# Patient Record
Sex: Female | Born: 1940 | Race: White | Hispanic: No | Smoking: Never smoker
Health system: Southern US, Community
[De-identification: ages and names within clinical notes are randomized; demographics above are authoritative.]

## PROBLEM LIST (undated history)

## (undated) DIAGNOSIS — R001 Bradycardia, unspecified: Secondary | ICD-10-CM

## (undated) DIAGNOSIS — M545 Low back pain, unspecified: Secondary | ICD-10-CM

## (undated) DIAGNOSIS — I10 Essential (primary) hypertension: Secondary | ICD-10-CM

## (undated) DIAGNOSIS — M4626 Osteomyelitis of vertebra, lumbar region: Secondary | ICD-10-CM

## (undated) DIAGNOSIS — H9319 Tinnitus, unspecified ear: Secondary | ICD-10-CM

## (undated) DIAGNOSIS — S52501A Unspecified fracture of the lower end of right radius, initial encounter for closed fracture: Secondary | ICD-10-CM

## (undated) DIAGNOSIS — G309 Alzheimer's disease, unspecified: Secondary | ICD-10-CM

## (undated) DIAGNOSIS — M159 Polyosteoarthritis, unspecified: Secondary | ICD-10-CM

## (undated) DIAGNOSIS — F329 Major depressive disorder, single episode, unspecified: Secondary | ICD-10-CM

## (undated) DIAGNOSIS — N189 Chronic kidney disease, unspecified: Secondary | ICD-10-CM

## (undated) DIAGNOSIS — F32A Depression, unspecified: Secondary | ICD-10-CM

## (undated) DIAGNOSIS — E669 Obesity, unspecified: Secondary | ICD-10-CM

## (undated) DIAGNOSIS — F0280 Dementia in other diseases classified elsewhere without behavioral disturbance: Secondary | ICD-10-CM

## (undated) DIAGNOSIS — G8929 Other chronic pain: Secondary | ICD-10-CM

## (undated) DIAGNOSIS — F419 Anxiety disorder, unspecified: Secondary | ICD-10-CM

## (undated) DIAGNOSIS — G4730 Sleep apnea, unspecified: Secondary | ICD-10-CM

## (undated) DIAGNOSIS — G7000 Myasthenia gravis without (acute) exacerbation: Secondary | ICD-10-CM

## (undated) DIAGNOSIS — E119 Type 2 diabetes mellitus without complications: Secondary | ICD-10-CM

## (undated) DIAGNOSIS — G2581 Restless legs syndrome: Secondary | ICD-10-CM

## (undated) DIAGNOSIS — N183 Chronic kidney disease, stage 3 (moderate): Secondary | ICD-10-CM

## (undated) HISTORY — DX: Essential (primary) hypertension: I10

## (undated) HISTORY — DX: Polyosteoarthritis, unspecified: M15.9

## (undated) HISTORY — DX: Low back pain: M54.5

## (undated) HISTORY — DX: Low back pain, unspecified: M54.50

## (undated) HISTORY — DX: Sleep apnea, unspecified: G47.30

## (undated) HISTORY — DX: Alzheimer's disease, unspecified: G30.9

## (undated) HISTORY — DX: Dementia in other diseases classified elsewhere, unspecified severity, without behavioral disturbance, psychotic disturbance, mood disturbance, and anxiety: F02.80

## (undated) HISTORY — DX: Anxiety disorder, unspecified: F41.9

## (undated) HISTORY — DX: Restless legs syndrome: G25.81

## (undated) HISTORY — PX: ABDOMINAL HYSTERECTOMY: SHX81

## (undated) HISTORY — DX: Bradycardia, unspecified: R00.1

## (undated) HISTORY — DX: Other chronic pain: G89.29

## (undated) HISTORY — DX: Tinnitus, unspecified ear: H93.19

## (undated) HISTORY — PX: CERVICAL SPINE SURGERY: SHX589

## (undated) HISTORY — DX: Osteomyelitis of vertebra, lumbar region: M46.26

## (undated) HISTORY — DX: Unspecified fracture of the lower end of right radius, initial encounter for closed fracture: S52.501A

## (undated) HISTORY — DX: Chronic kidney disease, stage 3 (moderate): N18.3

## (undated) HISTORY — PX: LUMBAR SPINE SURGERY: SHX701

## (undated) HISTORY — DX: Type 2 diabetes mellitus without complications: E11.9

---

## 2004-11-22 ENCOUNTER — Ambulatory Visit: Payer: Self-pay | Admitting: Family Medicine

## 2004-12-15 ENCOUNTER — Inpatient Hospital Stay: Payer: Self-pay | Admitting: Cardiology

## 2004-12-15 ENCOUNTER — Other Ambulatory Visit: Payer: Self-pay

## 2004-12-19 ENCOUNTER — Other Ambulatory Visit: Payer: Self-pay

## 2005-01-01 ENCOUNTER — Ambulatory Visit: Payer: Self-pay | Admitting: Family Medicine

## 2005-01-18 ENCOUNTER — Ambulatory Visit: Payer: Self-pay | Admitting: Family Medicine

## 2005-08-14 ENCOUNTER — Ambulatory Visit: Payer: Self-pay | Admitting: Psychiatry

## 2005-11-02 ENCOUNTER — Ambulatory Visit: Payer: Self-pay | Admitting: Psychiatry

## 2005-11-16 ENCOUNTER — Ambulatory Visit: Payer: Self-pay | Admitting: Psychiatry

## 2006-01-29 ENCOUNTER — Ambulatory Visit: Payer: Self-pay | Admitting: Family Medicine

## 2006-02-15 ENCOUNTER — Ambulatory Visit: Payer: Self-pay | Admitting: Family Medicine

## 2009-11-10 ENCOUNTER — Ambulatory Visit: Payer: Self-pay | Admitting: Family Medicine

## 2010-10-10 DIAGNOSIS — I10 Essential (primary) hypertension: Secondary | ICD-10-CM

## 2010-10-10 HISTORY — DX: Essential (primary) hypertension: I10

## 2012-02-15 ENCOUNTER — Ambulatory Visit: Payer: Self-pay | Admitting: Family Medicine

## 2012-11-18 ENCOUNTER — Encounter: Payer: Self-pay | Admitting: Internal Medicine

## 2012-12-01 ENCOUNTER — Encounter: Payer: Self-pay | Admitting: Internal Medicine

## 2012-12-09 LAB — BASIC METABOLIC PANEL
Anion Gap: 4 — ABNORMAL LOW (ref 7–16)
Calcium, Total: 8.5 mg/dL (ref 8.5–10.1)
Co2: 31 mmol/L (ref 21–32)
Creatinine: 0.82 mg/dL (ref 0.60–1.30)
EGFR (Non-African Amer.): >60
Glucose: 94 mg/dL (ref 65–99)
Osmolality: 280 (ref 275–301)
Potassium: 3.3 mmol/L — ABNORMAL LOW (ref 3.5–5.1)

## 2012-12-19 ENCOUNTER — Emergency Department: Payer: Self-pay | Admitting: Emergency Medicine

## 2012-12-19 LAB — COMPREHENSIVE METABOLIC PANEL
Albumin: 2.5 g/dL — ABNORMAL LOW (ref 3.4–5.0)
Alkaline Phosphatase: 133 U/L (ref 50–136)
Anion Gap: 9 (ref 7–16)
Bilirubin,Total: 0.6 mg/dL (ref 0.2–1.0)
Co2: 23 mmol/L (ref 21–32)
Creatinine: 0.88 mg/dL (ref 0.60–1.30)
EGFR (African American): >60
EGFR (Non-African Amer.): >60
SGOT(AST): 29 U/L (ref 15–37)
SGPT (ALT): 15 U/L (ref 12–78)

## 2012-12-19 LAB — URINALYSIS, COMPLETE
Bacteria: NONE SEEN
Blood: NEGATIVE
Glucose,UR: NEGATIVE mg/dL (ref 0–75)
Ketone: NEGATIVE
Leukocyte Esterase: NEGATIVE
Protein: NEGATIVE
RBC,UR: 1 /HPF (ref 0–5)
Specific Gravity: 1.015 (ref 1.003–1.030)
Squamous Epithelial: 1
WBC UR: <1 /HPF (ref 0–5)

## 2012-12-19 LAB — CBC
HCT: 32.6 % — ABNORMAL LOW (ref 35.0–47.0)
HGB: 10.8 g/dL — ABNORMAL LOW (ref 12.0–16.0)
MCH: 30.2 pg (ref 26.0–34.0)
MCHC: 33 g/dL (ref 32.0–36.0)
MCV: 92 fL (ref 80–100)
RBC: 3.55 10*6/uL — ABNORMAL LOW (ref 3.80–5.20)
RDW: 15.3 % — ABNORMAL HIGH (ref 11.5–14.5)
WBC: 7 10*3/uL (ref 3.6–11.0)

## 2012-12-19 LAB — LIPASE, BLOOD: Lipase: 148 U/L (ref 73–393)

## 2012-12-31 DIAGNOSIS — M4626 Osteomyelitis of vertebra, lumbar region: Secondary | ICD-10-CM

## 2012-12-31 HISTORY — PX: CHOLECYSTECTOMY: SHX55

## 2012-12-31 HISTORY — DX: Osteomyelitis of vertebra, lumbar region: M46.26

## 2013-01-08 ENCOUNTER — Ambulatory Visit: Payer: Self-pay | Admitting: Gerontology

## 2013-01-15 ENCOUNTER — Encounter: Payer: Self-pay | Admitting: Internal Medicine

## 2013-01-19 LAB — CBC WITH DIFFERENTIAL/PLATELET
Basophil #: 0.1 10*3/uL (ref 0.0–0.1)
Eosinophil #: 0.3 10*3/uL (ref 0.0–0.7)
HCT: 29.9 % — ABNORMAL LOW (ref 35.0–47.0)
HGB: 9.8 g/dL — ABNORMAL LOW (ref 12.0–16.0)
Lymphocyte #: 1.6 10*3/uL (ref 1.0–3.6)
Lymphocyte %: 39.2 %
MCHC: 32.7 g/dL (ref 32.0–36.0)
MCV: 91 fL (ref 80–100)
Monocyte %: 14.4 %
Neutrophil #: 1.5 10*3/uL (ref 1.4–6.5)
Platelet: 264 10*3/uL (ref 150–440)
RBC: 3.3 10*6/uL — ABNORMAL LOW (ref 3.80–5.20)
RDW: 15.3 % — ABNORMAL HIGH (ref 11.5–14.5)
WBC: 4.1 10*3/uL (ref 3.6–11.0)

## 2013-01-19 LAB — COMPREHENSIVE METABOLIC PANEL
Alkaline Phosphatase: 118 U/L (ref 50–136)
Anion Gap: 4 — ABNORMAL LOW (ref 7–16)
BUN: 11 mg/dL (ref 7–18)
Bilirubin,Total: 0.3 mg/dL (ref 0.2–1.0)
Chloride: 106 mmol/L (ref 98–107)
Co2: 30 mmol/L (ref 21–32)
Creatinine: 0.85 mg/dL (ref 0.60–1.30)
Glucose: 83 mg/dL (ref 65–99)
Osmolality: 278 (ref 275–301)
Potassium: 3.3 mmol/L — ABNORMAL LOW (ref 3.5–5.1)
Total Protein: 5.8 g/dL — ABNORMAL LOW (ref 6.4–8.2)

## 2013-01-19 LAB — SEDIMENTATION RATE: Erythrocyte Sed Rate: 44 mm/hr — ABNORMAL HIGH (ref 0–30)

## 2013-01-26 LAB — CBC WITH DIFFERENTIAL/PLATELET
Basophil #: 0.1 10*3/uL (ref 0.0–0.1)
Eosinophil #: 0.2 10*3/uL (ref 0.0–0.7)
HCT: 32.4 % — ABNORMAL LOW (ref 35.0–47.0)
HGB: 10.6 g/dL — ABNORMAL LOW (ref 12.0–16.0)
Lymphocyte %: 41.5 %
MCH: 29.6 pg (ref 26.0–34.0)
MCV: 90 fL (ref 80–100)
Monocyte %: 12.6 %
Neutrophil #: 1.9 10*3/uL (ref 1.4–6.5)
Platelet: 266 10*3/uL (ref 150–440)
RDW: 15.3 % — ABNORMAL HIGH (ref 11.5–14.5)
WBC: 4.8 10*3/uL (ref 3.6–11.0)

## 2013-01-26 LAB — SEDIMENTATION RATE: Erythrocyte Sed Rate: 39 mm/hr — ABNORMAL HIGH (ref 0–30)

## 2013-01-26 LAB — COMPREHENSIVE METABOLIC PANEL
Albumin: 2.2 g/dL — ABNORMAL LOW (ref 3.4–5.0)
Alkaline Phosphatase: 135 U/L (ref 50–136)
Anion Gap: 5 — ABNORMAL LOW (ref 7–16)
Co2: 30 mmol/L (ref 21–32)
EGFR (African American): >60
EGFR (Non-African Amer.): >60
Osmolality: 280 (ref 275–301)
Potassium: 3.8 mmol/L (ref 3.5–5.1)
Sodium: 140 mmol/L (ref 136–145)

## 2013-01-31 ENCOUNTER — Encounter: Payer: Self-pay | Admitting: Internal Medicine

## 2013-02-02 LAB — CBC WITH DIFFERENTIAL/PLATELET
Basophil %: 1.4 %
Eosinophil #: 0.2 10*3/uL (ref 0.0–0.7)
HGB: 10.3 g/dL — ABNORMAL LOW (ref 12.0–16.0)
Lymphocyte #: 1.7 10*3/uL (ref 1.0–3.6)
Lymphocyte %: 28.3 %
MCHC: 33.2 g/dL (ref 32.0–36.0)
MCV: 90 fL (ref 80–100)
Monocyte #: 0.5 x10 3/mm (ref 0.2–0.9)
Monocyte %: 8.8 %
RDW: 15.4 % — ABNORMAL HIGH (ref 11.5–14.5)
WBC: 6.1 10*3/uL (ref 3.6–11.0)

## 2013-02-02 LAB — COMPREHENSIVE METABOLIC PANEL
Alkaline Phosphatase: 139 U/L — ABNORMAL HIGH (ref 50–136)
Anion Gap: 3 — ABNORMAL LOW (ref 7–16)
Bilirubin,Total: 0.2 mg/dL (ref 0.2–1.0)
Chloride: 105 mmol/L (ref 98–107)
Co2: 31 mmol/L (ref 21–32)
Creatinine: 0.81 mg/dL (ref 0.60–1.30)
EGFR (Non-African Amer.): >60
Glucose: 87 mg/dL (ref 65–99)
SGOT(AST): 17 U/L (ref 15–37)
SGPT (ALT): 13 U/L (ref 12–78)
Total Protein: 6 g/dL — ABNORMAL LOW (ref 6.4–8.2)

## 2013-02-02 LAB — SEDIMENTATION RATE: Erythrocyte Sed Rate: 43 mm/hr — ABNORMAL HIGH (ref 0–30)

## 2013-02-04 ENCOUNTER — Emergency Department: Payer: Self-pay | Admitting: Emergency Medicine

## 2013-02-04 LAB — CBC WITH DIFFERENTIAL/PLATELET
Basophil %: 0.9 %
Eosinophil #: 0.1 10*3/uL (ref 0.0–0.7)
HGB: 11.5 g/dL — ABNORMAL LOW (ref 12.0–16.0)
Lymphocyte #: 1.4 10*3/uL (ref 1.0–3.6)
Lymphocyte %: 14.8 %
MCH: 30.5 pg (ref 26.0–34.0)
Monocyte #: 0.8 x10 3/mm (ref 0.2–0.9)
Monocyte %: 8.1 %
Neutrophil #: 7.3 10*3/uL — ABNORMAL HIGH (ref 1.4–6.5)
Neutrophil %: 75.5 %
Platelet: 313 10*3/uL (ref 150–440)
RBC: 3.78 10*6/uL — ABNORMAL LOW (ref 3.80–5.20)
RDW: 15.7 % — ABNORMAL HIGH (ref 11.5–14.5)
WBC: 9.6 10*3/uL (ref 3.6–11.0)

## 2013-02-04 LAB — COMPREHENSIVE METABOLIC PANEL
Alkaline Phosphatase: 150 U/L — ABNORMAL HIGH (ref 50–136)
Anion Gap: 7 (ref 7–16)
BUN: 15 mg/dL (ref 7–18)
Bilirubin,Total: 0.5 mg/dL (ref 0.2–1.0)
Co2: 26 mmol/L (ref 21–32)
Creatinine: 0.96 mg/dL (ref 0.60–1.30)
Glucose: 109 mg/dL — ABNORMAL HIGH (ref 65–99)
Osmolality: 277 (ref 275–301)
Potassium: 3.6 mmol/L (ref 3.5–5.1)
SGOT(AST): 24 U/L (ref 15–37)
Sodium: 138 mmol/L (ref 136–145)
Total Protein: 6.8 g/dL (ref 6.4–8.2)

## 2013-02-27 ENCOUNTER — Other Ambulatory Visit: Payer: Self-pay | Admitting: Family Medicine

## 2013-02-27 LAB — CREATININE, SERUM
Creatinine: 0.67 mg/dL (ref 0.60–1.30)
EGFR (Non-African Amer.): >60

## 2013-02-27 LAB — VANCOMYCIN, TROUGH: Vancomycin, Trough: 13 ug/mL (ref 10–20)

## 2013-03-02 ENCOUNTER — Other Ambulatory Visit: Payer: Self-pay | Admitting: Family Medicine

## 2013-03-02 ENCOUNTER — Encounter: Payer: Self-pay | Admitting: Internal Medicine

## 2013-03-02 LAB — CREATININE, SERUM
Creatinine: 0.83 mg/dL (ref 0.60–1.30)
EGFR (African American): >60
EGFR (Non-African Amer.): >60

## 2013-03-02 LAB — VANCOMYCIN, TROUGH: Vancomycin, Trough: 16 ug/mL (ref 10–20)

## 2013-03-03 ENCOUNTER — Other Ambulatory Visit: Payer: Self-pay | Admitting: Family Medicine

## 2013-03-03 LAB — COMPREHENSIVE METABOLIC PANEL
Alkaline Phosphatase: 121 U/L — ABNORMAL HIGH
Calcium, Total: 8.6 mg/dL (ref 8.5–10.1)
Chloride: 105 mmol/L (ref 98–107)
Co2: 30 mmol/L (ref 21–32)
EGFR (Non-African Amer.): 52 — ABNORMAL LOW
Potassium: 3.3 mmol/L — ABNORMAL LOW (ref 3.5–5.1)
SGOT(AST): 21 U/L (ref 15–37)
SGPT (ALT): 11 U/L — ABNORMAL LOW (ref 12–78)

## 2013-03-03 LAB — CBC WITH DIFFERENTIAL/PLATELET
Basophil %: 1 %
HGB: 11.2 g/dL — ABNORMAL LOW (ref 12.0–16.0)
Lymphocyte #: 1 10*3/uL (ref 1.0–3.6)
MCH: 30.4 pg (ref 26.0–34.0)
MCHC: 32.9 g/dL (ref 32.0–36.0)
Monocyte %: 7.8 %
Neutrophil #: 4.2 10*3/uL (ref 1.4–6.5)
Neutrophil %: 72.7 %
Platelet: 274 10*3/uL (ref 150–440)
WBC: 5.7 10*3/uL (ref 3.6–11.0)

## 2013-03-06 ENCOUNTER — Other Ambulatory Visit: Payer: Self-pay | Admitting: Family Medicine

## 2013-03-06 LAB — URINALYSIS, COMPLETE
Bacteria: NONE SEEN
Glucose,UR: NEGATIVE mg/dL (ref 0–75)
Ketone: NEGATIVE
Leukocyte Esterase: NEGATIVE
RBC,UR: 1 /HPF (ref 0–5)
Squamous Epithelial: 3

## 2013-03-09 ENCOUNTER — Other Ambulatory Visit: Payer: Self-pay | Admitting: Family Medicine

## 2013-03-09 LAB — CREATININE, SERUM
Creatinine: 0.92 mg/dL (ref 0.60–1.30)
EGFR (Non-African Amer.): >60

## 2013-03-09 LAB — VANCOMYCIN, TROUGH: Vancomycin, Trough: 19 ug/mL (ref 10–20)

## 2013-03-11 ENCOUNTER — Other Ambulatory Visit: Payer: Self-pay | Admitting: Family Medicine

## 2013-03-11 LAB — CREATININE, SERUM
EGFR (African American): >60
EGFR (Non-African Amer.): >60

## 2013-03-11 LAB — VANCOMYCIN, TROUGH: Vancomycin, Trough: 21 ug/mL (ref 10–20)

## 2013-03-16 ENCOUNTER — Other Ambulatory Visit: Payer: Self-pay | Admitting: Family Medicine

## 2013-03-16 LAB — VANCOMYCIN, TROUGH: Vancomycin, Trough: 16 ug/mL (ref 10–20)

## 2013-03-16 LAB — CREATININE, SERUM
Creatinine: 0.84 mg/dL (ref 0.60–1.30)
EGFR (African American): >60
EGFR (Non-African Amer.): >60

## 2013-03-20 ENCOUNTER — Other Ambulatory Visit: Payer: Self-pay | Admitting: Family Medicine

## 2013-03-20 LAB — CREATININE, SERUM: Creatinine: 0.94 mg/dL (ref 0.60–1.30)

## 2013-03-20 LAB — VANCOMYCIN, TROUGH: Vancomycin, Trough: 18 ug/mL (ref 10–20)

## 2013-03-22 ENCOUNTER — Other Ambulatory Visit: Payer: Self-pay | Admitting: Family Medicine

## 2013-03-22 LAB — CREATININE, SERUM
Creatinine: 0.82 mg/dL (ref 0.60–1.30)
EGFR (African American): >60

## 2013-03-22 LAB — VANCOMYCIN, TROUGH: Vancomycin, Trough: 17 ug/mL (ref 10–20)

## 2013-03-23 ENCOUNTER — Other Ambulatory Visit: Payer: Self-pay | Admitting: Family Medicine

## 2013-03-23 LAB — CLOSTRIDIUM DIFFICILE(ARMC)

## 2013-03-31 ENCOUNTER — Other Ambulatory Visit: Payer: Self-pay | Admitting: Family Medicine

## 2013-03-31 LAB — URINALYSIS, COMPLETE
Bilirubin,UR: NEGATIVE
Ketone: NEGATIVE
Ph: 5 (ref 4.5–8.0)
RBC,UR: 61 /HPF (ref 0–5)
Squamous Epithelial: 307
WBC UR: 1048 /HPF (ref 0–5)

## 2013-04-02 ENCOUNTER — Encounter: Payer: Self-pay | Admitting: Internal Medicine

## 2013-04-04 LAB — URINE CULTURE

## 2013-04-07 ENCOUNTER — Other Ambulatory Visit: Payer: Self-pay | Admitting: Family Medicine

## 2013-04-08 LAB — CLOSTRIDIUM DIFFICILE(ARMC)

## 2013-04-09 ENCOUNTER — Other Ambulatory Visit: Payer: Self-pay | Admitting: Family Medicine

## 2013-04-09 LAB — CLOSTRIDIUM DIFFICILE(ARMC)

## 2013-04-14 ENCOUNTER — Other Ambulatory Visit: Payer: Self-pay | Admitting: Family Medicine

## 2013-04-14 LAB — URINALYSIS, COMPLETE
Blood: NEGATIVE
Glucose,UR: NEGATIVE mg/dL (ref 0–75)
Ketone: NEGATIVE
Nitrite: NEGATIVE
PH: 5 (ref 4.5–8.0)
Protein: 30
RBC,UR: 15 /HPF (ref 0–5)
SPECIFIC GRAVITY: 1.036 (ref 1.003–1.030)
WBC UR: 9 /HPF (ref 0–5)

## 2013-04-17 LAB — URINE CULTURE

## 2014-06-06 DIAGNOSIS — S52501A Unspecified fracture of the lower end of right radius, initial encounter for closed fracture: Secondary | ICD-10-CM

## 2014-06-06 HISTORY — DX: Unspecified fracture of the lower end of right radius, initial encounter for closed fracture: S52.501A

## 2014-08-03 DIAGNOSIS — N183 Chronic kidney disease, stage 3 unspecified: Secondary | ICD-10-CM

## 2014-08-03 HISTORY — DX: Chronic kidney disease, stage 3 unspecified: N18.30

## 2014-09-29 ENCOUNTER — Other Ambulatory Visit: Payer: Self-pay | Admitting: Family Medicine

## 2014-09-29 DIAGNOSIS — Z780 Asymptomatic menopausal state: Secondary | ICD-10-CM

## 2015-07-14 ENCOUNTER — Encounter: Payer: Self-pay | Admitting: *Deleted

## 2015-07-18 ENCOUNTER — Encounter: Admission: RE | Payer: Self-pay | Source: Ambulatory Visit

## 2015-07-18 ENCOUNTER — Ambulatory Visit: Admission: RE | Admit: 2015-07-18 | Payer: Medicare HMO | Source: Ambulatory Visit | Admitting: Gastroenterology

## 2015-07-18 HISTORY — DX: Essential (primary) hypertension: I10

## 2015-07-18 HISTORY — DX: Major depressive disorder, single episode, unspecified: F32.9

## 2015-07-18 HISTORY — DX: Chronic kidney disease, unspecified: N18.9

## 2015-07-18 HISTORY — DX: Obesity, unspecified: E66.9

## 2015-07-18 HISTORY — DX: Type 2 diabetes mellitus without complications: E11.9

## 2015-07-18 HISTORY — DX: Myasthenia gravis without (acute) exacerbation: G70.00

## 2015-07-18 HISTORY — DX: Depression, unspecified: F32.A

## 2015-07-18 SURGERY — ESOPHAGOGASTRODUODENOSCOPY (EGD) WITH PROPOFOL
Anesthesia: General

## 2015-09-06 ENCOUNTER — Other Ambulatory Visit: Payer: Self-pay | Admitting: Family Medicine

## 2015-09-06 DIAGNOSIS — Z780 Asymptomatic menopausal state: Secondary | ICD-10-CM

## 2016-03-16 ENCOUNTER — Other Ambulatory Visit: Payer: Self-pay | Admitting: Family Medicine

## 2016-03-16 DIAGNOSIS — R413 Other amnesia: Secondary | ICD-10-CM

## 2016-03-27 ENCOUNTER — Ambulatory Visit: Payer: Medicare HMO

## 2016-06-04 ENCOUNTER — Inpatient Hospital Stay
Admission: EM | Admit: 2016-06-04 | Discharge: 2016-06-05 | DRG: 195 | Disposition: A | Discharge status: Home Health | Payer: MEDICARE | Attending: Internal Medicine | Admitting: Internal Medicine

## 2016-06-04 ENCOUNTER — Encounter: Payer: Self-pay | Admitting: *Deleted

## 2016-06-04 ENCOUNTER — Emergency Department: Payer: MEDICARE

## 2016-06-04 DIAGNOSIS — Z79899 Other long term (current) drug therapy: Secondary | ICD-10-CM

## 2016-06-04 DIAGNOSIS — E1122 Type 2 diabetes mellitus with diabetic chronic kidney disease: Secondary | ICD-10-CM | POA: Diagnosis present

## 2016-06-04 DIAGNOSIS — R410 Disorientation, unspecified: Secondary | ICD-10-CM

## 2016-06-04 DIAGNOSIS — Z885 Allergy status to narcotic agent status: Secondary | ICD-10-CM

## 2016-06-04 DIAGNOSIS — I129 Hypertensive chronic kidney disease with stage 1 through stage 4 chronic kidney disease, or unspecified chronic kidney disease: Secondary | ICD-10-CM | POA: Diagnosis present

## 2016-06-04 DIAGNOSIS — F0280 Dementia in other diseases classified elsewhere without behavioral disturbance: Secondary | ICD-10-CM | POA: Diagnosis not present

## 2016-06-04 DIAGNOSIS — J189 Pneumonia, unspecified organism: Secondary | ICD-10-CM | POA: Diagnosis present

## 2016-06-04 DIAGNOSIS — E876 Hypokalemia: Secondary | ICD-10-CM | POA: Diagnosis not present

## 2016-06-04 DIAGNOSIS — N189 Chronic kidney disease, unspecified: Secondary | ICD-10-CM | POA: Diagnosis not present

## 2016-06-04 DIAGNOSIS — Z880 Allergy status to penicillin: Secondary | ICD-10-CM

## 2016-06-04 DIAGNOSIS — G7000 Myasthenia gravis without (acute) exacerbation: Secondary | ICD-10-CM | POA: Diagnosis not present

## 2016-06-04 DIAGNOSIS — Z888 Allergy status to other drugs, medicaments and biological substances status: Secondary | ICD-10-CM | POA: Diagnosis not present

## 2016-06-04 DIAGNOSIS — G309 Alzheimer's disease, unspecified: Secondary | ICD-10-CM | POA: Diagnosis not present

## 2016-06-04 LAB — CBC
HEMATOCRIT: 39.4 % (ref 35.0–47.0)
HEMOGLOBIN: 13.3 g/dL (ref 12.0–16.0)
MCH: 32.1 pg (ref 26.0–34.0)
MCHC: 33.8 g/dL (ref 32.0–36.0)
MCV: 95.2 fL (ref 80.0–100.0)
Platelets: 274 10*3/uL (ref 150–440)
RBC: 4.14 MIL/uL (ref 3.80–5.20)
RDW: 13.3 % (ref 11.5–14.5)
WBC: 9.4 10*3/uL (ref 3.6–11.0)

## 2016-06-04 LAB — TROPONIN I: Troponin I: <0.03 ng/mL (ref <0.03)

## 2016-06-04 LAB — BASIC METABOLIC PANEL
ANION GAP: 7 (ref 5–15)
BUN: 15 mg/dL (ref 6–20)
CO2: 27 mmol/L (ref 22–32)
Calcium: 9.1 mg/dL (ref 8.9–10.3)
Chloride: 105 mmol/L (ref 101–111)
Creatinine, Ser: 0.81 mg/dL (ref 0.44–1.00)
GFR calc Af Amer: >60 mL/min (ref >60)
GLUCOSE: 175 mg/dL — AB (ref 65–99)
POTASSIUM: 3.1 mmol/L — AB (ref 3.5–5.1)
SODIUM: 139 mmol/L (ref 135–145)

## 2016-06-04 LAB — INFLUENZA PANEL BY PCR (TYPE A & B)
INFLAPCR: NEGATIVE
Influenza B By PCR: NEGATIVE

## 2016-06-04 LAB — LACTIC ACID, PLASMA: LACTIC ACID, VENOUS: 1.3 mmol/L (ref 0.5–1.9)

## 2016-06-04 MED ORDER — PYRIDOSTIGMINE BROMIDE 60 MG PO TABS
60.0000 mg | ORAL_TABLET | Freq: Three times a day (TID) | ORAL | Status: DC
  Administered 2016-06-04 – 2016-06-05 (×3): 60 mg via ORAL
  Filled 2016-06-04 (×5): qty 1

## 2016-06-04 MED ORDER — FLUOXETINE HCL 20 MG PO CAPS
20.0000 mg | ORAL_CAPSULE | Freq: Every day | ORAL | Status: DC
  Administered 2016-06-05: 20 mg via ORAL
  Filled 2016-06-04: qty 1

## 2016-06-04 MED ORDER — DEXTROSE 5 % IV SOLN: 1.0000 g | Freq: Once | INTRAVENOUS | Status: DC

## 2016-06-04 MED ORDER — DEXTROSE 5 % IV SOLN
1.0000 g | INTRAVENOUS | Status: DC
  Administered 2016-06-05: 1 g via INTRAVENOUS
  Filled 2016-06-04: qty 10

## 2016-06-04 MED ORDER — SODIUM CHLORIDE 0.9 % IV SOLN
Freq: Once | INTRAVENOUS | Status: AC
  Administered 2016-06-04: 22:00:00 via INTRAVENOUS

## 2016-06-04 MED ORDER — ONDANSETRON HCL 4 MG PO TABS: 4.0000 mg | ORAL_TABLET | Freq: Four times a day (QID) | ORAL | Status: DC | PRN

## 2016-06-04 MED ORDER — OXYCODONE HCL ER 10 MG PO T12A
10.0000 mg | EXTENDED_RELEASE_TABLET | Freq: Two times a day (BID) | ORAL | Status: DC
  Administered 2016-06-04 – 2016-06-05 (×2): 10 mg via ORAL
  Filled 2016-06-04 (×2): qty 1

## 2016-06-04 MED ORDER — OXYBUTYNIN CHLORIDE 5 MG PO TABS
5.0000 mg | ORAL_TABLET | Freq: Three times a day (TID) | ORAL | Status: DC
  Administered 2016-06-04 – 2016-06-05 (×3): 5 mg via ORAL
  Filled 2016-06-04 (×3): qty 1

## 2016-06-04 MED ORDER — ACETAMINOPHEN 325 MG PO TABS: 650.0000 mg | ORAL_TABLET | Freq: Four times a day (QID) | ORAL | Status: DC | PRN

## 2016-06-04 MED ORDER — ONDANSETRON HCL 4 MG/2ML IJ SOLN: 4.0000 mg | Freq: Four times a day (QID) | INTRAMUSCULAR | Status: DC | PRN

## 2016-06-04 MED ORDER — LIOTHYRONINE SODIUM 25 MCG PO TABS
25.0000 ug | ORAL_TABLET | Freq: Every day | ORAL | Status: DC
  Administered 2016-06-05: 25 ug via ORAL
  Filled 2016-06-04: qty 1

## 2016-06-04 MED ORDER — CEFTRIAXONE SODIUM-DEXTROSE 1-3.74 GM-% IV SOLR
1.0000 g | Freq: Once | INTRAVENOUS | Status: AC
  Administered 2016-06-04: 1 g via INTRAVENOUS
  Filled 2016-06-04: qty 50

## 2016-06-04 MED ORDER — GUAIFENESIN-DM 100-10 MG/5ML PO SYRP
5.0000 mL | ORAL_SOLUTION | ORAL | Status: DC | PRN
  Administered 2016-06-04: 5 mL via ORAL
  Filled 2016-06-04 (×2): qty 5

## 2016-06-04 MED ORDER — FOLIC ACID 1 MG PO TABS
1.0000 mg | ORAL_TABLET | Freq: Every day | ORAL | Status: DC
  Administered 2016-06-05: 1 mg via ORAL
  Filled 2016-06-04: qty 1

## 2016-06-04 MED ORDER — ENOXAPARIN SODIUM 40 MG/0.4ML ~~LOC~~ SOLN
40.0000 mg | SUBCUTANEOUS | Status: DC
  Administered 2016-06-04: 40 mg via SUBCUTANEOUS
  Filled 2016-06-04: qty 0.4

## 2016-06-04 MED ORDER — DEXTROSE 5 % IV SOLN
500.0000 mg | INTRAVENOUS | Status: DC
  Administered 2016-06-05: 500 mg via INTRAVENOUS
  Filled 2016-06-04: qty 500

## 2016-06-04 MED ORDER — ALBUTEROL SULFATE (2.5 MG/3ML) 0.083% IN NEBU: 2.5000 mg | INHALATION_SOLUTION | RESPIRATORY_TRACT | Status: DC | PRN

## 2016-06-04 MED ORDER — MYCOPHENOLATE MOFETIL 250 MG PO CAPS
250.0000 mg | ORAL_CAPSULE | Freq: Two times a day (BID) | ORAL | Status: DC
  Administered 2016-06-04 – 2016-06-05 (×2): 250 mg via ORAL
  Filled 2016-06-04 (×3): qty 1

## 2016-06-04 MED ORDER — POTASSIUM CHLORIDE CRYS ER 20 MEQ PO TBCR
20.0000 meq | EXTENDED_RELEASE_TABLET | Freq: Every day | ORAL | Status: DC
  Administered 2016-06-05: 20 meq via ORAL
  Filled 2016-06-04: qty 1

## 2016-06-04 MED ORDER — INSULIN ASPART 100 UNIT/ML ~~LOC~~ SOLN: 0.0000 [IU] | Freq: Three times a day (TID) | SUBCUTANEOUS | Status: DC

## 2016-06-04 MED ORDER — TRAZODONE HCL 50 MG PO TABS
50.0000 mg | ORAL_TABLET | Freq: Every day | ORAL | Status: DC
  Administered 2016-06-04: 50 mg via ORAL
  Filled 2016-06-04: qty 1

## 2016-06-04 MED ORDER — ACETAMINOPHEN 650 MG RE SUPP
650.0000 mg | Freq: Four times a day (QID) | RECTAL | Status: DC | PRN
  Filled 2016-06-04: qty 1

## 2016-06-04 MED ORDER — AZITHROMYCIN 500 MG IV SOLR
500.0000 mg | Freq: Once | INTRAVENOUS | Status: AC
  Administered 2016-06-04: 500 mg via INTRAVENOUS
  Filled 2016-06-04 (×2): qty 500

## 2016-06-04 MED ORDER — POLYETHYLENE GLYCOL 3350 17 G PO PACK: 17.0000 g | PACK | Freq: Every day | ORAL | Status: DC | PRN

## 2016-06-04 MED ORDER — FAMOTIDINE 20 MG PO TABS
20.0000 mg | ORAL_TABLET | Freq: Two times a day (BID) | ORAL | Status: DC
  Administered 2016-06-04 – 2016-06-05 (×2): 20 mg via ORAL
  Filled 2016-06-04 (×2): qty 1

## 2016-06-04 MED ORDER — FUROSEMIDE 20 MG PO TABS
20.0000 mg | ORAL_TABLET | Freq: Every day | ORAL | Status: DC
  Administered 2016-06-05: 20 mg via ORAL
  Filled 2016-06-04: qty 1

## 2016-06-04 MED ORDER — BUSPIRONE HCL 10 MG PO TABS
20.0000 mg | ORAL_TABLET | Freq: Two times a day (BID) | ORAL | Status: DC
  Administered 2016-06-04 – 2016-06-05 (×2): 20 mg via ORAL
  Filled 2016-06-04 (×3): qty 2

## 2016-06-04 MED ORDER — SODIUM CHLORIDE 0.9 % IV SOLN
30.0000 meq | Freq: Once | INTRAVENOUS | Status: AC
  Administered 2016-06-04: 30 meq via INTRAVENOUS
  Filled 2016-06-04: qty 15

## 2016-06-04 NOTE — H&P (Signed)
[facility] - [place] at [facility]   PATIENT NAME: Glenda Stephens    MR#:  [id number]  DATE OF BIRTH:  1941-03-20  DATE OF ADMISSION:  06/04/2016  PRIMARY CARE PHYSICIAN: No primary care provider on file.   REQUESTING/REFERRING PHYSICIAN: Dr. York Cerise  CHIEF COMPLAINT:   Chief Complaint  Patient presents with  . Cough    HISTORY OF PRESENT ILLNESS:  Glenda Stephens  is a 76 y.o. female with a known history of Hypertension, diabetes, Alzheimer's dementia who was recently treated for UTI with ciprofloxacin presents to the emergency room with 5 days of cough, weakness, worsening confusion. Chest x-ray checked as outpatient showed multifocal pneumonia and was sent to the emergency room. Here patient does not have fever or WBC but due to her advanced age and history of myasthenia gravis and failed outpatient therapies being admitted as inpatient for IV antibiotics. She has a dry cough. Afebrile. No sick contacts. We'll test pending.  PAST MEDICAL HISTORY:   Past Medical History:  Diagnosis Date  . Chronic kidney disease   . Depression   . Diabetes mellitus without complication (HCC)   . Hypertension   . Myasthenia gravis without (acute) exacerbation (HCC)   . Obesity     PAST SURGICAL HISTORY:   Past Surgical History:  Procedure Laterality Date  . ABDOMINAL HYSTERECTOMY    . CERVICAL SPINE SURGERY    . CHOLECYSTECTOMY    . LUMBAR SPINE SURGERY      SOCIAL HISTORY:   Social History  Substance Use Topics  . Smoking status: Never Smoker  . Smokeless tobacco: Never Used  . Alcohol use No    FAMILY HISTORY:  History reviewed. No pertinent family history.  DRUG ALLERGIES:   Allergies  Allergen Reactions  . Ativan [Lorazepam]   . Botox [Botulinum Toxin Type A]   . Codeine   . Dilaudid [Hydromorphone]   . Magnesium-Containing Compounds   . Penicillamine   . Penicillins     REVIEW OF SYSTEMS:   Review of Systems  Unable to perform ROS: Dementia     MEDICATIONS AT HOME:   Prior to Admission medications   Medication Sig Start Date End Date Taking? Authorizing Provider  acetaminophen (TYLENOL) 325 MG tablet Take 650 mg by mouth 3 (three) times daily.    Historical Provider, MD  busPIRone (BUSPAR) 10 MG tablet Take 20 mg by mouth 2 (two) times daily.    Historical Provider, MD  calcitonin, salmon, (MIACALCIN/FORTICAL) 200 UNIT/ACT nasal spray Place 1 spray into alternate nostrils daily.    Historical Provider, MD  diclofenac (VOLTAREN) 50 MG EC tablet Take 50 mg by mouth 2 (two) times daily.    Historical Provider, MD  FLUoxetine (PROZAC) 20 MG capsule Take 20 mg by mouth daily.    Historical Provider, MD  FLUoxetine (PROZAC) 40 MG capsule Take 40 mg by mouth daily.    Historical Provider, MD  folic acid (FOLVITE) 1 MG tablet Take 1 mg by mouth daily.    Historical Provider, MD  furosemide (LASIX) 20 MG tablet Take 20 mg by mouth.    Historical Provider, MD  liothyronine (CYTOMEL) 25 MCG tablet Take by mouth daily.    Historical Provider, MD  Multiple Vitamin (MULTIVITAMIN) tablet Take 1 tablet by mouth daily.    Historical Provider, MD  mycophenolate (CELLCEPT) 250 MG capsule Take by mouth 2 (two) times daily.    Historical Provider, MD  omeprazole (PRILOSEC) 20 MG capsule Take 20 mg by mouth daily.  Historical Provider, MD  oxybutynin (DITROPAN) 5 MG tablet Take 5 mg by mouth 3 (three) times daily.    Historical Provider, MD  oxyCODONE (OXYCONTIN) 10 mg 12 hr tablet Take 10 mg by mouth every 12 (twelve) hours.    Historical Provider, MD  potassium chloride (K-DUR) 10 MEQ tablet Take 10 mEq by mouth daily.    Historical Provider, MD  pyridostigmine (MESTINON) 60 MG tablet Take 60 mg by mouth 3 (three) times daily.    Historical Provider, MD  ranitidine (ZANTAC) 150 MG capsule Take 150 mg by mouth 2 (two) times daily.    Historical Provider, MD  tiZANidine (ZANAFLEX) 2 MG tablet Take by mouth 3 (three) times daily as needed for muscle  spasms.    Historical Provider, MD  traZODone (DESYREL) 50 MG tablet Take 50 mg by mouth at bedtime.    Historical Provider, MD     VITAL SIGNS:  Blood pressure (!) 144/74, pulse (!) 52, temperature 98.1 F (36.7 C), temperature source Oral, resp. rate 15, height 5\' 10"  (1.778 m), weight 90.3 kg (199 lb), SpO2 97 %.  PHYSICAL EXAMINATION:  Physical Exam  GENERAL:  76 y.o.-year-old patient lying in the bed with no acute distress.  EYES: Pupils equal, round, reactive to light and accommodation. No scleral icterus. Extraocular muscles intact.  HEENT: Head atraumatic, normocephalic. Oropharynx and nasopharynx clear. No oropharyngeal erythema, moist oral mucosa  NECK:  Supple, no jugular venous distention. No thyroid enlargement, no tenderness.  LUNGS: Bilateral crackles and rhonchi CARDIOVASCULAR: S1, S2 normal. No murmurs, rubs, or gallops.  ABDOMEN: Soft, nontender, nondistended. Bowel sounds present. No organomegaly or mass.  EXTREMITIES: No pedal edema, cyanosis, or clubbing. + 2 pedal & radial pulses b/l.   NEUROLOGIC: Cranial nerves II through XII are intact. No focal Motor or sensory deficits appreciated b/l PSYCHIATRIC: The patient is alert and awake. Pleasantly confused. SKIN: No obvious rash, lesion, or ulcer.   LABORATORY PANEL:   CBC  Recent Labs Lab 06/04/16 1922  WBC 9.4  HGB 13.3  HCT 39.4  PLT 274   ------------------------------------------------------------------------------------------------------------------  Chemistries   Recent Labs Lab 06/04/16 1922  NA 139  K 3.1*  CL 105  CO2 27  GLUCOSE 175*  BUN 15  CREATININE 0.81  CALCIUM 9.1   ------------------------------------------------------------------------------------------------------------------  Cardiac Enzymes  Recent Labs Lab 06/04/16 1922  TROPONINI <0.03   ------------------------------------------------------------------------------------------------------------------  RADIOLOGY:   Dg Chest 2 View  Result Date: 06/04/2016 CLINICAL DATA:  Bilateral pneumonia EXAM: CHEST  2 VIEW COMPARISON:  12/19/2012, FINDINGS: Patchy opacities are present within the left upper lobe, lingula and bilateral lung bases. Borderline cardiomegaly. No edema. No pneumothorax. Postsurgical changes in the right humeral head. Moderate contiguous compression deformities at the thoracolumbar junction. IMPRESSION: 1. Multifocal opacities in the left upper lobe, lingula and bilateral lung bases suspicious for multifocal pneumonia. Radiographic follow-up to resolution is recommended 2. Moderate compression deformities at the thoracolumbar junction Electronically Signed   By: Jasmine PangKim  Fujinaga M.D.   On: 06/04/2016 20:27     IMPRESSION AND PLAN:   * Multifocal pneumonia and a 76 year old patient who has been on ciprofloxacin as outpatient Start IV ceftriaxone and azithromycin. Admit inpatient. Check influenza PCR. Blood and sputum cultures. Oxygen as needed nebulizers when necessary  * Acute encephalopathy over dementia due to acute illness and pneumonia Watch for inpatient delirium in the hospital  * Diabetes mellitus Diabetic diet. Sliding scale insulin. Home medications  * Hypertension Continue home medications  * DVT prophylaxis with  Lovenox  All the records are reviewed and case discussed with ED provider. Management plans discussed with the patient, family and they are in agreement.  CODE STATUS: FULL CODE  TOTAL TIME TAKING CARE OF THIS PATIENT: 40 minutes.   Milagros Loll R M.D on 06/04/2016 at 9:40 PM  Between 7am to 6pm - Pager - [phone]  After 6pm go to [email or web address] - password [email or web address] [facility]  [facility] [place] [facility]  Office  [phone]  CC: Primary care physician; No primary care provider on file.  Note: This dictation was prepared with Dragon dictation along with smaller phrase technology. Any transcriptional errors that result from this process are unintentional.

## 2016-06-04 NOTE — ED Triage Notes (Addendum)
Pt to triage via wheelchair.  Pt dx today with bil pneumonia at clinic in [place] today.  Pt sent to er for eval . No chest pain.  Denies sob.  Pt had recent uti.  Hx alz dz.  Pt alert.  Speech clear.

## 2016-06-04 NOTE — ED Provider Notes (Signed)
[facility] Emergency Department Provider Note  ____________________________________________   First MD Initiated Contact with Patient 06/04/16 2032     (approximate)  I have reviewed the triage vital signs and the nursing notes.   HISTORY  Chief Complaint Cough  Patient has mild dementia at baseline which may affect accuracy of HPI  HPI Glenda Stephens is a 76 y.o. female with a PMH that includes myasthenia gravis who presents for evaluation of possible pneumonia.  She developed a cough and other viral symptoms about 5 days ago which started to get better but then got much worse over the last 24-48 hours.  She had been on Cipro for a possible UTI prescribed by her primary care doctor.  Her daughter reports that she has been increasingly confused and hallucinating with primarily visual hallucinations over the last couple of days as well.  This is atypical for her even with her mild chronic dementia.  They followed up with her PCP this afternoon and he recommended she come to the emergency department for evaluation of possible pneumonia.  She is short of breath mostly due to her persistent cough.  The cough is worse at night, when lying flat, and with exertion.  The symptoms are severe.  She has some chest pain as a result of the coughing that she describes as sharp and going through primarily from the right side through to her back.  Nothing in particular makes her symptoms better or worse.  She denies fever/chills, nausea, vomiting, abdominal pain.   Past Medical History:  Diagnosis Date  . Chronic kidney disease   . Depression   . Diabetes mellitus without complication (HCC)   . Hypertension   . Myasthenia gravis without (acute) exacerbation (HCC)   . Obesity     Patient Active Problem List   Diagnosis Date Noted  . Multifocal pneumonia 06/04/2016    Past Surgical History:  Procedure Laterality Date  . ABDOMINAL HYSTERECTOMY    . CERVICAL SPINE  SURGERY    . CHOLECYSTECTOMY    . LUMBAR SPINE SURGERY      Prior to Admission medications   Medication Sig Start Date End Date Taking? Authorizing Provider  acetaminophen (TYLENOL) 325 MG tablet Take 650 mg by mouth 3 (three) times daily.   Yes Historical Provider, MD  busPIRone (BUSPAR) 10 MG tablet Take 20 mg by mouth 2 (two) times daily.   Yes Historical Provider, MD  calcitonin, salmon, (MIACALCIN/FORTICAL) 200 UNIT/ACT nasal spray Place 1 spray into alternate nostrils daily.   Yes Historical Provider, MD  diclofenac (VOLTAREN) 50 MG EC tablet Take 50 mg by mouth 2 (two) times daily.   Yes Historical Provider, MD  FLUoxetine (PROZAC) 20 MG capsule Take 20 mg by mouth daily.   Yes Historical Provider, MD  FLUoxetine (PROZAC) 40 MG capsule Take 40 mg by mouth daily.   Yes Historical Provider, MD  folic acid (FOLVITE) 1 MG tablet Take 1 mg by mouth daily.   Yes Historical Provider, MD  furosemide (LASIX) 20 MG tablet Take 20 mg by mouth.   Yes Historical Provider, MD  liothyronine (CYTOMEL) 25 MCG tablet Take by mouth daily.   Yes Historical Provider, MD  memantine (NAMENDA) 10 MG tablet Take 1 tablet by mouth 2 (two) times daily. 05/30/16  Yes Historical Provider, MD  Multiple Vitamin (MULTIVITAMIN) tablet Take 1 tablet by mouth daily.   Yes Historical Provider, MD  mycophenolate (CELLCEPT) 250 MG capsule Take by mouth 2 (two) times daily.  Yes Historical Provider, MD  omeprazole (PRILOSEC) 20 MG capsule Take 20 mg by mouth daily.   Yes Historical Provider, MD  oxybutynin (DITROPAN) 5 MG tablet Take 5 mg by mouth 3 (three) times daily.   Yes Historical Provider, MD  oxyCODONE (OXYCONTIN) 10 mg 12 hr tablet Take 10 mg by mouth every 12 (twelve) hours.   Yes Historical Provider, MD  potassium chloride (K-DUR) 10 MEQ tablet Take 10 mEq by mouth daily.   Yes Historical Provider, MD  pyridostigmine (MESTINON) 60 MG tablet Take 60 mg by mouth 3 (three) times daily.   Yes Historical Provider, MD    ranitidine (ZANTAC) 150 MG capsule Take 150 mg by mouth 2 (two) times daily.   Yes Historical Provider, MD  tiZANidine (ZANAFLEX) 2 MG tablet Take by mouth 3 (three) times daily as needed for muscle spasms.   Yes Historical Provider, MD  traZODone (DESYREL) 50 MG tablet Take 50 mg by mouth at bedtime.   Yes Historical Provider, MD  benzonatate (TESSALON) 200 MG capsule Take 1 capsule by mouth 3 (three) times daily. 05/30/16   Historical Provider, MD  ciprofloxacin (CIPRO) 250 MG tablet Take 1 tablet by mouth 2 (two) times daily. 05/30/16   Historical Provider, MD    Allergies Ativan [lorazepam]; Botox [botulinum toxin type a]; Codeine; Dilaudid [hydromorphone]; Magnesium-containing compounds; Penicillamine; and Penicillins  History reviewed. No pertinent family history.  Social History Social History  Substance Use Topics  . Smoking status: Never Smoker  . Smokeless tobacco: Never Used  . Alcohol use No    Review of Systems Constitutional: No fever/chills Eyes: No visual changes. ENT: No sore throat. Cardiovascular: chest pain When coughing Respiratory: Severe cough and resulting shortness of breath Gastrointestinal: No abdominal pain.  No nausea, no vomiting.  No diarrhea.  No constipation. Genitourinary: Negative for dysuria. Musculoskeletal: Negative for back pain. Skin: Negative for rash. Neurological: Negative for headaches, focal weakness or numbness. Psych:  Recent visual hallucinations and increased confusion  10-point ROS otherwise negative.  ____________________________________________   PHYSICAL EXAM:  ED Triage Vitals  Enc Vitals Group     BP 06/04/16 2128 (!) 144/74     Pulse Rate 06/04/16 1920 66     Resp 06/04/16 1920 20     Temp 06/04/16 1920 98.1 F (36.7 C)     Temp Source 06/04/16 1920 Oral     SpO2 06/04/16 1920 98 %     Weight 06/04/16 1920 199 lb (90.3 kg)     Height 06/04/16 1920 5\' 10"  (1.778 m)     Head Circumference --      Peak Flow --       Pain Score --      Pain Loc --      Pain Edu? --      Excl. in GC? --      Constitutional: Alert, oriented to self and location.  Appears ill but non-toxic Eyes: Conjunctivae are normal. PERRL. EOMI. Head: Atraumatic. Nose: +congestion/rhinnorhea. Mouth/Throat: Mucous membranes are moist. Neck: No stridor.  No meningeal signs.   Cardiovascular: Normal rate, regular rhythm. Good peripheral circulation. Grossly normal heart sounds. Respiratory: Increased respiratory effort due to frequent coughing.  Very frequent cough, non-productive.  No retractions. Scattered rales throughout. Gastrointestinal: Soft and nontender. No distention.  Musculoskeletal: No lower extremity tenderness nor edema. No gross deformities of extremities. Neurologic:  Normal speech and language. No gross focal neurologic deficits are appreciated.  Skin:  Skin is warm, dry and intact. No rash  noted.   ____________________________________________   LABS (all labs ordered are listed, but only abnormal results are displayed)  Labs Reviewed  BASIC METABOLIC PANEL - Abnormal; Notable for the following:       Result Value   Potassium 3.1 (*)    Glucose, Bld 175 (*)    All other components within normal limits  CULTURE, BLOOD (ROUTINE X 2)  CULTURE, BLOOD (ROUTINE X 2)  CULTURE, EXPECTORATED SPUTUM-ASSESSMENT  CBC  TROPONIN I  LACTIC ACID, PLASMA  INFLUENZA PANEL BY PCR (TYPE A & B)  URINALYSIS, COMPLETE (UACMP) WITH MICROSCOPIC  LACTIC ACID, PLASMA  HEMOGLOBIN A1C   ____________________________________________  EKG  ED ECG REPORT I, Verneda Hollopeter, the attending physician, personally viewed and interpreted this ECG.  Date: 06/04/2016 EKG Time: 19:33 Rate: 69 Rhythm: normal sinus rhythm QRS Axis: LAD Intervals: LVH with QRS widening ST/T Wave abnormalities: Non-specific ST segment / T-wave changes, but no evidence of acute ischemia.   Conduction Disturbances: none Narrative Interpretation:  unremarkable, no significant changes since last EKG several years ago.  ____________________________________________  RADIOLOGY   Dg Chest 2 View  Result Date: 06/04/2016 CLINICAL DATA:  Bilateral pneumonia EXAM: CHEST  2 VIEW COMPARISON:  12/19/2012, FINDINGS: Patchy opacities are present within the left upper lobe, lingula and bilateral lung bases. Borderline cardiomegaly. No edema. No pneumothorax. Postsurgical changes in the right humeral head. Moderate contiguous compression deformities at the thoracolumbar junction. IMPRESSION: 1. Multifocal opacities in the left upper lobe, lingula and bilateral lung bases suspicious for multifocal pneumonia. Radiographic follow-up to resolution is recommended 2. Moderate compression deformities at the thoracolumbar junction Electronically Signed   By: Jasmine Pang M.D.   On: 06/04/2016 20:27    ____________________________________________   PROCEDURES  Procedure(s) performed:   Procedures   Critical Care performed: No ____________________________________________   INITIAL IMPRESSION / ASSESSMENT AND PLAN / ED COURSE  Pertinent labs & imaging results that were available during my care of the patient were reviewed by me and considered in my medical decision making (see chart for details).  Does not meet sepsis criteria but the patient has pneumonia and what appears to be 4 different locations, frequent cough to the point that she cannot breathe very well, myasthenia gravis, and acute delirium on top of chronic dementia.  Although she has been on Cipro that is not appropriate for treatment of community-acquired pneumonia so I will start her on ceftriaxone and azithromycin.  Although she has reported history of allergy to penicillin her daughter believes it was a mild rash that she has as a child so I believe the risk of cross-reactivity is very low.  Labs are reassuring, lactic acid is pending, blood cultures are pending.  Will admit for further  management given all the reasons stated above.   Clinical Course as of Jun 04 2225  Mon Jun 04, 2016  2226 Of note, the daughter mentioned that the patient always has bradycardia in the 50s, and her blood pressure is usually low (below 100 systolic) at baseline as well.  [CF]    Clinical Course User Index [CF] Loleta Rose, MD    ____________________________________________  FINAL CLINICAL IMPRESSION(S) / ED DIAGNOSES  Final diagnoses:  Community acquired pneumonia, unspecified laterality  Delirium  Hypokalemia     MEDICATIONS GIVEN DURING THIS VISIT:  Medications  azithromycin (ZITHROMAX) 500 mg in dextrose 5 % 250 mL IVPB (500 mg Intravenous New Bag/Given 06/04/16 2116)  cefTRIAXone (ROCEPHIN) IVPB 1 g (1 g Intravenous Given 06/04/16 2114)  potassium  chloride 30 mEq in sodium chloride 0.9 % 265 mL (KCL MULTIRUN) IVPB   0.9 %  sodium chloride infusion      NEW OUTPATIENT MEDICATIONS STARTED DURING THIS VISIT:  New Prescriptions   No medications on file    Modified Medications   No medications on file    Discontinued Medications   No medications on file     Note:  This document was prepared using Dragon voice recognition software and may include unintentional dictation errors.    Loleta Rose, MD 06/04/16 2227

## 2016-06-04 NOTE — ED Notes (Signed)
Patient transported to X-ray 

## 2016-06-04 NOTE — Progress Notes (Signed)
Pharmacy Antibiotic Note  Glenda Stephens is a 76 y.o. female admitted on 06/04/2016 with sepsis.  Pharmacy has been consulted for ceftriaxone and azithromycin dosing.  Plan: Azithromycin 500mg  IV Q24H.  Ceftriaxone 1g IV Q24H.  Height: 5\' 10"  (177.8 cm) Weight: 199 lb (90.3 kg) IBW/kg (Calculated) : 68.5  Temp (24hrs), Avg:98.1 F (36.7 C), Min:98.1 F (36.7 C), Max:98.1 F (36.7 C)   Recent Labs Lab 06/04/16 1922  WBC 9.4  CREATININE 0.81    Estimated Creatinine Clearance: 70.9 mL/min (by C-G formula based on SCr of 0.81 mg/dL).    Allergies  Allergen Reactions  . Ativan [Lorazepam]   . Botox [Botulinum Toxin Type A]   . Codeine   . Dilaudid [Hydromorphone]   . Magnesium-Containing Compounds   . Penicillamine   . Penicillins     Antimicrobials this admission: Ceftriaxone 3/5 >>  Azithromycin 3/5 >>   Dose adjustments this admission: N/A  Microbiology results: 3/5 BCx: pending  Thank you for allowing pharmacy to be a part of this patient's care.  Stormy CardKatsoudas,Cherrelle Plante K, RPh 06/04/2016 9:09 PM

## 2016-06-05 DIAGNOSIS — J189 Pneumonia, unspecified organism: Secondary | ICD-10-CM | POA: Diagnosis not present

## 2016-06-05 LAB — GLUCOSE, CAPILLARY
GLUCOSE-CAPILLARY: 120 mg/dL — AB (ref 65–99)
Glucose-Capillary: 108 mg/dL — ABNORMAL HIGH (ref 65–99)
Glucose-Capillary: 97 mg/dL (ref 65–99)

## 2016-06-05 LAB — BASIC METABOLIC PANEL
ANION GAP: 7 (ref 5–15)
BUN: 14 mg/dL (ref 6–20)
CHLORIDE: 109 mmol/L (ref 101–111)
CO2: 25 mmol/L (ref 22–32)
Calcium: 8.8 mg/dL — ABNORMAL LOW (ref 8.9–10.3)
Creatinine, Ser: 0.82 mg/dL (ref 0.44–1.00)
GFR calc non Af Amer: >60 mL/min (ref >60)
GLUCOSE: 121 mg/dL — AB (ref 65–99)
Potassium: 3.8 mmol/L (ref 3.5–5.1)
Sodium: 141 mmol/L (ref 135–145)

## 2016-06-05 MED ORDER — GUAIFENESIN ER 600 MG PO TB12: 600.0000 mg | ORAL_TABLET | Freq: Two times a day (BID) | ORAL | 0 refills | Status: AC

## 2016-06-05 MED ORDER — GUAIFENESIN ER 600 MG PO TB12
600.0000 mg | ORAL_TABLET | Freq: Two times a day (BID) | ORAL | Status: DC
  Administered 2016-06-05: 600 mg via ORAL
  Filled 2016-06-05: qty 1

## 2016-06-05 MED ORDER — LEVOFLOXACIN 750 MG PO TABS: 750.0000 mg | ORAL_TABLET | Freq: Every day | ORAL | 0 refills | Status: DC

## 2016-06-05 NOTE — Evaluation (Signed)
Physical Therapy Evaluation Patient Details Name: Glenda Stephens MRN: [id number] DOB: 05/12/1940 Today's Date: 06/05/2016   History of Present Illness  76 yo female with onset of acute encephalopathy was seen, has repleted K+, CAP B lungs, recent UTI.  PMHx:  thoracolumbar compression fractures, CKD, myasthenia gravis, DM.    Clinical Impression  Pt is working on gait and balance in sitting and standing today, with clear signs that she will need 24/7 help at home if nothing cognitively changes.  She is having some falls and noted her R knee has been giving out, causing her some anxiety with gait on the hallway on RW.  Will follow acutely for strength and balance and will transition home with HHPT and has all equipment needed.  Otherwise is willing to work and will need to try stairs to get home safely.    Follow Up Recommendations Home health PT;Supervision/Assistance - 24 hour    Equipment Recommendations  None recommended by PT    Recommendations for Other Services       Precautions / Restrictions Precautions Precautions: Fall (ck sats) Restrictions Weight Bearing Restrictions: No      Mobility  Bed Mobility Overal bed mobility: Needs Assistance Bed Mobility: Supine to Sit;Sit to Supine     Supine to sit: Min assist;HOB elevated (help to scoot hips out of EOB) Sit to supine: Min assist (mainly for lifting legs onto bed)   General bed mobility comments: using bed rail and flattened bed to get back in  Transfers Overall transfer level: Needs assistance Equipment used: Rolling walker (2 wheeled);1 person hand held assist Transfers: Sit to/from [employer] Sit to Stand: Min assist Stand pivot transfers: Min guard;Min assist       General transfer comment: reminders for sequence and safety  Ambulation/Gait Ambulation/Gait assistance: Min assist;Min guard Ambulation Distance (Feet): 100 Feet Assistive device: Rolling walker (2 wheeled);1 person hand held  assist Gait Pattern/deviations: Step-through pattern;Wide base of support;Trunk flexed;Antalgic;Decreased stride length Gait velocity: reduced Gait velocity interpretation: Below normal speed for age/gender General Gait Details: careful with her R knee as she reports it "buckles" and has led to many falls  Stairs            Wheelchair Mobility    Modified Rankin (Stroke Patients Only)       Balance Overall balance assessment: Needs assistance;History of Falls Sitting-balance support: Feet supported;Single extremity supported Sitting balance-Leahy Scale: Fair   Postural control: Posterior lean Standing balance support: Bilateral upper extremity supported Standing balance-Leahy Scale: Poor Standing balance comment: pt stood from bed to get back in, did not respond to why she stood to get back to bed from bedside                             Pertinent Vitals/Pain Pain Assessment: No/denies pain    Home Living Family/patient expects to be discharged to:: Private residence Living Arrangements: Children Available Help at Discharge: Family;Available PRN/intermittently Type of Home: House Home Access: Stairs to enter Entrance Stairs-Rails: Right;Left;Can reach both Entrance Stairs-Number of Steps: 3 Home Layout: One level Home Equipment: Walker - 2 wheels;Cane - single point;Wheelchair - manual Additional Comments: Pt has some cognitive changes and may have given less than complete information    Prior Function Level of Independence: Needs assistance   Gait / Transfers Assistance Needed: RW with frequent falls but reports she is I  ADL's / Homemaking Assistance Needed: help with family including a  niece who comes 3x per week for shopping and housework  Comments: no family here to assist with information     Hand Dominance        Extremity/Trunk Assessment   Upper Extremity Assessment Upper Extremity Assessment: Overall WFL for tasks assessed     Lower Extremity Assessment Lower Extremity Assessment: Generalized weakness    Cervical / Trunk Assessment Cervical / Trunk Assessment: Kyphotic  Communication   Communication: Expressive difficulties  Cognition Arousal/Alertness: Awake/alert Behavior During Therapy: Impulsive Overall Cognitive Status: No family/caregiver present to determine baseline cognitive functioning                      General Comments General comments (skin integrity, edema, etc.): Pt is demonstrating some safety issues, has been home with help but cannot outline exactly how and how much she gets help.  Stated at one point that her daughters are not 24/7 help but are helping her    Exercises     Assessment/Plan    PT Assessment Patient needs continued PT services  PT Problem List Decreased strength;Decreased range of motion;Decreased activity tolerance;Decreased balance;Decreased mobility;Decreased coordination;Decreased cognition;Decreased knowledge of use of DME;Decreased safety awareness;Cardiopulmonary status limiting activity;Obesity       PT Treatment Interventions DME instruction;Gait training;Stair training;Functional mobility training;Therapeutic activities;Therapeutic exercise;Balance training;Neuromuscular re-education;Patient/family education    PT Goals (Current goals can be found in the Care Plan section)  Acute Rehab PT Goals Patient Stated Goal: to stay at home and not have to go to ALF PT Goal Formulation: With patient Time For Goal Achievement: 06/19/16 Potential to Achieve Goals: Good    Frequency Min 2X/week   Barriers to discharge Inaccessible home environment;Decreased caregiver support stairs to enter house and reports less than 24/7 help    Co-evaluation               End of Session Equipment Utilized During Treatment: Gait belt Activity Tolerance: Patient tolerated treatment well;Patient limited by fatigue;Other (comment) (limited gait by her ability to  safely manage her R knee) Patient left: in bed;with call bell/phone within reach;with bed alarm set Nurse Communication: Mobility status PT Visit Diagnosis: Repeated falls (R29.6);Unsteadiness on feet (R26.81)    Functional Assessment Tool Used: AM-PAC 6 Clicks Basic Mobility    Time: [id number] PT Time Calculation (min) (ACUTE ONLY): 27 min   Charges:   PT Evaluation $PT Eval Moderate Complexity: 1 Procedure PT Treatments $Gait Training: 8-22 mins   PT G Codes:   PT G-Codes **NOT FOR INPATIENT CLASS** Functional Assessment Tool Used: AM-PAC 6 Clicks Basic Mobility     Ivar DrapeRuth E Jakaya Jacobowitz 06/05/2016, 11:44 AM   Samul Dadauth Israella Hubert, PT MS Acute Rehab Dept. Number: [facility] [phone] and [facility] [phone]

## 2016-06-05 NOTE — Care Management (Signed)
Patient admitted with multifocal PNA.  Lives at home with daughters.  Ambulates with walker.  PCP OLMEDO.  Patient to discharge on oral antitiotics.  Home heath orders written for RN and PT.  Patient was provided with home health agency preference.  Patient states that she does not have a preference.  [facility]  RNCM signing off

## 2016-06-05 NOTE — Discharge Summary (Signed)
[facility] [place] [facility]   PATIENT NAME: Glenda ArenasLinda Stephens    MR#:  [id number]  DATE OF BIRTH:  Aug 22, 1940  DATE OF ADMISSION:  06/04/2016 ADMITTING PHYSICIAN: Milagros LollSrikar Sudini, MD  DATE OF DISCHARGE: 06/05/2016  PRIMARY CARE PHYSICIAN: Rolin Barrylmedo, Mario    ADMISSION DIAGNOSIS:  Hypokalemia [E87.6] Delirium [R41.0] Community acquired pneumonia, unspecified laterality [J18.9]  DISCHARGE DIAGNOSIS:  Active Problems:   Multifocal pneumonia   SECONDARY DIAGNOSIS:   Past Medical History:  Diagnosis Date  . Chronic kidney disease   . Depression   . Diabetes mellitus without complication (HCC)   . Hypertension   . Myasthenia gravis without (acute) exacerbation (HCC)   . Obesity     HOSPITAL COURSE:  76 y.o. female with a known history of Hypertension, diabetes, Alzheimer's dementia who was recently treated for UTI with ciprofloxacin presents to the emergency room with 5 days of cough, weakness, worsening confusion  1. Multifocal pneumonia : She was started on  IV ceftriaxone and azithromycin. She was afebrile, not hypoxic and without  Elevated WBC. Her main issue was a cough and her mental status has improved and is at baseline.  Testing for influenza was negative as were Blood cultures. She is discharged on ORAL Levaquin.  2 Acute encephalopathy over dementia due to acute illness and pneumonia This has improved  3. Diabetes mellitus: She will continue ADA diet  4. Myasthenia gravis: Patient will continue outpatient medications  5. Hypokalemia: This was repleted DISCHARGE CONDITIONS AND DIET:   Stable Regular diabetic diet  CONSULTS OBTAINED:    DRUG ALLERGIES:   Allergies  Allergen Reactions  . Ativan [Lorazepam] Other (See Comments)    Hallicinations  . Botox [Botulinum Toxin Type A]   . Codeine   . Dilaudid [Hydromorphone] Other (See Comments)    Hallicinations  . Magnesium-Containing Compounds   . Penicillamine   . Penicillins      DISCHARGE MEDICATIONS:   Current Discharge Medication List    START taking these medications   Details  guaiFENesin (MUCINEX) 600 MG 12 hr tablet Take 1 tablet (600 mg total) by mouth 2 (two) times daily. Qty: 42 tablet, Refills: 0    levofloxacin (LEVAQUIN) 750 MG tablet Take 1 tablet (750 mg total) by mouth daily. Qty: 5 tablet, Refills: 0      CONTINUE these medications which have NOT CHANGED   Details  acetaminophen (TYLENOL) 325 MG tablet Take 650 mg by mouth 3 (three) times daily.    busPIRone (BUSPAR) 10 MG tablet Take 20 mg by mouth 2 (two) times daily.    calcitonin, salmon, (MIACALCIN/FORTICAL) 200 UNIT/ACT nasal spray Place 1 spray into alternate nostrils daily.    diclofenac (VOLTAREN) 50 MG EC tablet Take 50 mg by mouth 2 (two) times daily.    !! FLUoxetine (PROZAC) 20 MG capsule Take 20 mg by mouth daily.    !! FLUoxetine (PROZAC) 40 MG capsule Take 40 mg by mouth daily.    folic acid (FOLVITE) 1 MG tablet Take 1 mg by mouth daily.    furosemide (LASIX) 20 MG tablet Take 20 mg by mouth.    liothyronine (CYTOMEL) 25 MCG tablet Take by mouth daily.    memantine (NAMENDA) 10 MG tablet Take 1 tablet by mouth 2 (two) times daily. Refills: 11    Multiple Vitamin (MULTIVITAMIN) tablet Take 1 tablet by mouth daily.    mycophenolate (CELLCEPT) 250 MG capsule Take by mouth 2 (two) times daily.    omeprazole (PRILOSEC) 20  MG capsule Take 20 mg by mouth daily.    oxybutynin (DITROPAN) 5 MG tablet Take 5 mg by mouth 3 (three) times daily.    oxyCODONE (OXYCONTIN) 10 mg 12 hr tablet Take 10 mg by mouth every 12 (twelve) hours.    potassium chloride (K-DUR) 10 MEQ tablet Take 10 mEq by mouth daily.    pyridostigmine (MESTINON) 60 MG tablet Take 60 mg by mouth 3 (three) times daily.    ranitidine (ZANTAC) 150 MG capsule Take 150 mg by mouth 2 (two) times daily.    tiZANidine (ZANAFLEX) 2 MG tablet Take by mouth 3 (three) times daily as needed for muscle spasms.     traZODone (DESYREL) 50 MG tablet Take 50 mg by mouth at bedtime.    benzonatate (TESSALON) 200 MG capsule Take 1 capsule by mouth 3 (three) times daily. Refills: 0     !! - Potential duplicate medications found. Please discuss with provider.    STOP taking these medications     ciprofloxacin (CIPRO) 250 MG tablet           Today   CHIEF COMPLAINT:  Cough but no SOB  MS at baseline    VITAL SIGNS:  Blood pressure (!) 141/49, pulse 68, temperature 97.4 F (36.3 C), temperature source Oral, resp. rate 15, height 5\' 10"  (1.778 m), weight 90.3 kg (199 lb), SpO2 96 %.   REVIEW OF SYSTEMS:  Review of Systems  Constitutional: Negative.  Negative for chills, fever and malaise/fatigue.  HENT: Negative.  Negative for ear discharge, ear pain, hearing loss, nosebleeds and sore throat.   Eyes: Negative.  Negative for blurred vision and pain.  Respiratory: Positive for cough. Negative for hemoptysis, shortness of breath and wheezing.   Cardiovascular: Negative.  Negative for chest pain, palpitations and leg swelling.  Gastrointestinal: Negative.  Negative for abdominal pain, blood in stool, diarrhea, nausea and vomiting.  Genitourinary: Negative.  Negative for dysuria.  Musculoskeletal: Negative.  Negative for back pain.  Skin: Negative.   Neurological: Negative for dizziness, tremors, speech change, focal weakness, seizures and headaches.       MG Uses walker at home  Endo/Heme/Allergies: Negative.  Does not bruise/bleed easily.  Psychiatric/Behavioral: Negative.  Negative for depression, hallucinations and suicidal ideas.     PHYSICAL EXAMINATION:  GENERAL:  76 y.o.-year-old patient lying in the bed with no acute distress.  NECK:  Supple, no jugular venous distention. No thyroid enlargement, no tenderness.  LUNGS: Normal breath sounds bilaterally, no wheezing, rales,rhonchi  No use of accessory muscles of respiration.  CARDIOVASCULAR: S1, S2 normal. No murmurs, rubs, or  gallops.  ABDOMEN: Soft, non-tender, non-distended. Bowel sounds present. No organomegaly or mass.  EXTREMITIES: No pedal edema, cyanosis, or clubbing.  PSYCHIATRIC: The patient is alert and oriented x 3.  SKIN: No obvious rash, lesion, or ulcer.   DATA REVIEW:   CBC  Recent Labs Lab 06/04/16 1922  WBC 9.4  HGB 13.3  HCT 39.4  PLT 274    Chemistries   Recent Labs Lab 06/04/16 1922  NA 139  K 3.1*  CL 105  CO2 27  GLUCOSE 175*  BUN 15  CREATININE 0.81  CALCIUM 9.1    Cardiac Enzymes  Recent Labs Lab 06/04/16 1922  TROPONINI <0.03    Microbiology Results  @[email or web address] @  RADIOLOGY:  Dg Chest 2 View  Result Date: 06/04/2016 CLINICAL DATA:  Bilateral pneumonia EXAM: CHEST  2 VIEW COMPARISON:  12/19/2012, FINDINGS: Patchy opacities are present within the left upper  lobe, lingula and bilateral lung bases. Borderline cardiomegaly. No edema. No pneumothorax. Postsurgical changes in the right humeral head. Moderate contiguous compression deformities at the thoracolumbar junction. IMPRESSION: 1. Multifocal opacities in the left upper lobe, lingula and bilateral lung bases suspicious for multifocal pneumonia. Radiographic follow-up to resolution is recommended 2. Moderate compression deformities at the thoracolumbar junction Electronically Signed   By: Jasmine Pang M.D.   On: 06/04/2016 20:27      Current Discharge Medication List    START taking these medications   Details  guaiFENesin (MUCINEX) 600 MG 12 hr tablet Take 1 tablet (600 mg total) by mouth 2 (two) times daily. Qty: 42 tablet, Refills: 0    levofloxacin (LEVAQUIN) 750 MG tablet Take 1 tablet (750 mg total) by mouth daily. Qty: 5 tablet, Refills: 0      CONTINUE these medications which have NOT CHANGED   Details  acetaminophen (TYLENOL) 325 MG tablet Take 650 mg by mouth 3 (three) times daily.    busPIRone (BUSPAR) 10 MG tablet Take 20 mg by mouth 2 (two) times daily.    calcitonin, salmon,  (MIACALCIN/FORTICAL) 200 UNIT/ACT nasal spray Place 1 spray into alternate nostrils daily.    diclofenac (VOLTAREN) 50 MG EC tablet Take 50 mg by mouth 2 (two) times daily.    !! FLUoxetine (PROZAC) 20 MG capsule Take 20 mg by mouth daily.    !! FLUoxetine (PROZAC) 40 MG capsule Take 40 mg by mouth daily.    folic acid (FOLVITE) 1 MG tablet Take 1 mg by mouth daily.    furosemide (LASIX) 20 MG tablet Take 20 mg by mouth.    liothyronine (CYTOMEL) 25 MCG tablet Take by mouth daily.    memantine (NAMENDA) 10 MG tablet Take 1 tablet by mouth 2 (two) times daily. Refills: 11    Multiple Vitamin (MULTIVITAMIN) tablet Take 1 tablet by mouth daily.    mycophenolate (CELLCEPT) 250 MG capsule Take by mouth 2 (two) times daily.    omeprazole (PRILOSEC) 20 MG capsule Take 20 mg by mouth daily.    oxybutynin (DITROPAN) 5 MG tablet Take 5 mg by mouth 3 (three) times daily.    oxyCODONE (OXYCONTIN) 10 mg 12 hr tablet Take 10 mg by mouth every 12 (twelve) hours.    potassium chloride (K-DUR) 10 MEQ tablet Take 10 mEq by mouth daily.    pyridostigmine (MESTINON) 60 MG tablet Take 60 mg by mouth 3 (three) times daily.    ranitidine (ZANTAC) 150 MG capsule Take 150 mg by mouth 2 (two) times daily.    tiZANidine (ZANAFLEX) 2 MG tablet Take by mouth 3 (three) times daily as needed for muscle spasms.    traZODone (DESYREL) 50 MG tablet Take 50 mg by mouth at bedtime.    benzonatate (TESSALON) 200 MG capsule Take 1 capsule by mouth 3 (three) times daily. Refills: 0     !! - Potential duplicate medications found. Please discuss with provider.    STOP taking these medications     ciprofloxacin (CIPRO) 250 MG tablet           Management plans discussed with the patient and daughter and she is in agreement. Stable for discharge home  Patient should follow up with PCP  CODE STATUS:     Code Status Orders        Start     Ordered   06/04/16 2138  Full code  Continuous     06/04/16  2138    Code Status  History    Date Active Date Inactive Code Status Order ID Comments User Context   This patient has a current code status but no historical code status.    Advance Directive Documentation   Flowsheet Row Most Recent Value  Type of Advance Directive  Living will  Pre-existing out of facility DNR order (yellow form or pink MOST form)  No data  "MOST" Form in Place?  No data      TOTAL TIME TAKING CARE OF THIS PATIENT: 38 minutes.    Note: This dictation was prepared with Dragon dictation along with smaller phrase technology. Any transcriptional errors that result from this process are unintentional.  Chyla Schlender M.D on 06/05/2016 at 9:06 AM  Between 7am to 6pm - Pager - [phone] After 6pm go to [email or web address] - Social research officer, government  [facility]  Office  [phone]  CC: Primary care physician; No primary care provider on file.

## 2016-06-05 NOTE — Discharge Instructions (Signed)
Community-Acquired Pneumonia, Adult °Pneumonia is an infection of the lungs. One type of pneumonia can happen while a person is in a hospital. A different type can happen when a person is not in a hospital (community-acquired pneumonia). It is easy for this kind to spread from person to person. It can spread to you if you breathe near an infected person who coughs or sneezes. Some symptoms include: °· A dry cough. °· A wet (productive) cough. °· Fever. °· Sweating. °· Chest pain. °Follow these instructions at home: °· Take over-the-counter and prescription medicines only as told by your doctor. °¨ Only take cough medicine if you are losing sleep. °¨ If you were prescribed an antibiotic medicine, take it as told by your doctor. Do not stop taking the antibiotic even if you start to feel better. °· Sleep with your head and neck raised (elevated). You can do this by putting a few pillows under your head, or you can sleep in a recliner. °· Do not use tobacco products. These include cigarettes, chewing tobacco, and e-cigarettes. If you need help quitting, ask your doctor. °· Drink enough water to keep your pee (urine) clear or pale yellow. °A shot (vaccine) can help prevent pneumonia. Shots are often suggested for: °· People older than 78 years of age. °· People older than 78 years of age: °¨ Who are having cancer treatment. °¨ Who have long-term (chronic) lung disease. °¨ Who have problems with their body's defense system (immune system). °You may also prevent pneumonia if you take these actions: °· Get the flu (influenza) shot every year. °· Go to the dentist as often as told. °· Wash your hands often. If soap and water are not available, use hand sanitizer. °Contact a doctor if: °· You have a fever. °· You lose sleep because your cough medicine does not help. °Get help right away if: °· You are short of breath and it gets worse. °· You have more chest pain. °· Your sickness gets worse. This is very serious if: °¨ You  are an older adult. °¨ Your body's defense system is weak. °· You cough up blood. °This information is not intended to replace advice given to you by your health care provider. Make sure you discuss any questions you have with your health care provider. °Document Released: 09/05/2007 Document Revised: 08/25/2015 Document Reviewed: 07/14/2014 °Elsevier Interactive Patient Education © 2017 [employer] ° °

## 2016-06-06 LAB — HEMOGLOBIN A1C
Hgb A1c MFr Bld: 5.4 % (ref 4.8–5.6)
Mean Plasma Glucose: 108 mg/dL

## 2016-06-09 LAB — CULTURE, BLOOD (ROUTINE X 2)
CULTURE: NO GROWTH
CULTURE: NO GROWTH
Special Requests: ADEQUATE

## 2016-06-22 ENCOUNTER — Other Ambulatory Visit: Payer: Self-pay | Admitting: Gastroenterology

## 2016-06-22 DIAGNOSIS — R1310 Dysphagia, unspecified: Secondary | ICD-10-CM

## 2016-07-10 ENCOUNTER — Ambulatory Visit
Admission: RE | Admit: 2016-07-10 | Discharge: 2016-07-10 | Disposition: A | Discharge status: Home / Self Care | Payer: Medicare HMO | Source: Ambulatory Visit | Attending: Gastroenterology | Admitting: Gastroenterology

## 2016-07-10 DIAGNOSIS — R1310 Dysphagia, unspecified: Secondary | ICD-10-CM | POA: Diagnosis present

## 2016-07-10 DIAGNOSIS — I700 Atherosclerosis of aorta: Secondary | ICD-10-CM | POA: Insufficient documentation

## 2017-05-26 ENCOUNTER — Emergency Department: Payer: Medicare HMO

## 2017-05-26 ENCOUNTER — Other Ambulatory Visit: Payer: Self-pay

## 2017-05-26 ENCOUNTER — Emergency Department
Admission: EM | Admit: 2017-05-26 | Discharge: 2017-05-26 | Disposition: A | Discharge status: Home / Self Care | Payer: Medicare HMO | Attending: Emergency Medicine | Admitting: Emergency Medicine

## 2017-05-26 ENCOUNTER — Encounter: Payer: Self-pay | Admitting: Emergency Medicine

## 2017-05-26 DIAGNOSIS — Y9389 Activity, other specified: Secondary | ICD-10-CM | POA: Diagnosis not present

## 2017-05-26 DIAGNOSIS — I129 Hypertensive chronic kidney disease with stage 1 through stage 4 chronic kidney disease, or unspecified chronic kidney disease: Secondary | ICD-10-CM | POA: Insufficient documentation

## 2017-05-26 DIAGNOSIS — N189 Chronic kidney disease, unspecified: Secondary | ICD-10-CM | POA: Insufficient documentation

## 2017-05-26 DIAGNOSIS — S0990XA Unspecified injury of head, initial encounter: Secondary | ICD-10-CM | POA: Insufficient documentation

## 2017-05-26 DIAGNOSIS — S60221A Contusion of right hand, initial encounter: Secondary | ICD-10-CM | POA: Diagnosis not present

## 2017-05-26 DIAGNOSIS — Y999 Unspecified external cause status: Secondary | ICD-10-CM | POA: Insufficient documentation

## 2017-05-26 DIAGNOSIS — Z79899 Other long term (current) drug therapy: Secondary | ICD-10-CM | POA: Diagnosis not present

## 2017-05-26 DIAGNOSIS — S61411A Laceration without foreign body of right hand, initial encounter: Secondary | ICD-10-CM | POA: Diagnosis not present

## 2017-05-26 DIAGNOSIS — S39012A Strain of muscle, fascia and tendon of lower back, initial encounter: Secondary | ICD-10-CM | POA: Diagnosis not present

## 2017-05-26 DIAGNOSIS — W0110XA Fall on same level from slipping, tripping and stumbling with subsequent striking against unspecified object, initial encounter: Secondary | ICD-10-CM | POA: Diagnosis not present

## 2017-05-26 DIAGNOSIS — Y92009 Unspecified place in unspecified non-institutional (private) residence as the place of occurrence of the external cause: Secondary | ICD-10-CM | POA: Diagnosis not present

## 2017-05-26 DIAGNOSIS — S6991XA Unspecified injury of right wrist, hand and finger(s), initial encounter: Secondary | ICD-10-CM | POA: Diagnosis present

## 2017-05-26 DIAGNOSIS — E119 Type 2 diabetes mellitus without complications: Secondary | ICD-10-CM | POA: Insufficient documentation

## 2017-05-26 NOTE — Discharge Instructions (Signed)
You should apply bacitracin or Neosporin ointment to the right hand skin tear twice a day for the next several days, and change the dressing.  Keep the area clean and dry.  Return to the ER for new, worsening, persistent severe pain, redness or rash around the wound, redness spreading up the arm, yellow discharge or drainage of pus, fevers, or any other new or worsening symptoms that concern you.

## 2017-05-26 NOTE — ED Notes (Signed)
Pt to ct and then to go to xr

## 2017-05-26 NOTE — ED Provider Notes (Signed)
[facility] Emergency Department Provider Note ____________________________________________   First MD Initiated Contact with Patient 05/26/17 2039     (approximate)  I have reviewed the triage vital signs and the nursing notes.   HISTORY  Chief Complaint Fall  History of present illness severely limited due to dementia  HPI Glenda Stephens is a 77 y.o. female with past medical history as noted below who presents with right hand injury after an apparent fall.  Per the daughter, the patient was apparently trying to move a bookcase or dresser, and it fell over onto her causing her to fall back.  The patient reports pain to her right hand, as well as some lower back pain.  She is not sure if she hit her head or loss consciousness.  He does have some chronic hip pain which also appears to be slightly exacerbated.  No recent illness or other acute complaints.  Past Medical History:  Diagnosis Date  . Chronic kidney disease   . Depression   . Diabetes mellitus without complication (HCC)   . Hypertension   . Myasthenia gravis without (acute) exacerbation (HCC)   . Obesity     Patient Active Problem List   Diagnosis Date Noted  . Multifocal pneumonia 06/04/2016    Past Surgical History:  Procedure Laterality Date  . ABDOMINAL HYSTERECTOMY    . CERVICAL SPINE SURGERY    . CHOLECYSTECTOMY    . LUMBAR SPINE SURGERY      Prior to Admission medications   Medication Sig Start Date End Date Taking? Authorizing Provider  acetaminophen (TYLENOL) 325 MG tablet Take 650 mg by mouth 3 (three) times daily.    [provider]  benzonatate (TESSALON) 200 MG capsule Take 1 capsule by mouth 3 (three) times daily. 05/30/16   [provider]  busPIRone (BUSPAR) 10 MG tablet Take 20 mg by mouth 2 (two) times daily.    [provider]  calcitonin, salmon, (MIACALCIN/FORTICAL) 200 UNIT/ACT nasal spray Place 1 spray into alternate nostrils daily.     [provider]  diclofenac (VOLTAREN) 50 MG EC tablet Take 50 mg by mouth 2 (two) times daily.    [provider]  FLUoxetine (PROZAC) 20 MG capsule Take 20 mg by mouth daily.    [provider]  FLUoxetine (PROZAC) 40 MG capsule Take 40 mg by mouth daily.    [provider]  folic acid (FOLVITE) 1 MG tablet Take 1 mg by mouth daily.    [provider]  furosemide (LASIX) 20 MG tablet Take 20 mg by mouth.    [provider]  levofloxacin (LEVAQUIN) 750 MG tablet Take 1 tablet (750 mg total) by mouth daily. 06/05/16   Adrian Saran, MD  liothyronine (CYTOMEL) 25 MCG tablet Take by mouth daily.    [provider]  memantine (NAMENDA) 10 MG tablet Take 1 tablet by mouth 2 (two) times daily. 05/30/16   [provider]  Multiple Vitamin (MULTIVITAMIN) tablet Take 1 tablet by mouth daily.    [provider]  mycophenolate (CELLCEPT) 250 MG capsule Take by mouth 2 (two) times daily.    [provider]  omeprazole (PRILOSEC) 20 MG capsule Take 20 mg by mouth daily.    [provider]  oxybutynin (DITROPAN) 5 MG tablet Take 5 mg by mouth 3 (three) times daily.    [provider]  oxyCODONE (OXYCONTIN) 10 mg 12 hr tablet Take 10 mg by mouth every 12 (twelve) hours.  [provider]  potassium chloride (K-DUR) 10 MEQ tablet Take 10 mEq by mouth daily.    [provider]  pyridostigmine (MESTINON) 60 MG tablet Take 60 mg by mouth 3 (three) times daily.    [provider]  ranitidine (ZANTAC) 150 MG capsule Take 150 mg by mouth 2 (two) times daily.    [provider]  tiZANidine (ZANAFLEX) 2 MG tablet Take by mouth 3 (three) times daily as needed for muscle spasms.    [provider]  traZODone (DESYREL) 50 MG tablet Take 50 mg by mouth at bedtime.    [provider]    Allergies Ativan [lorazepam]; Botox [botulinum toxin type a]; Codeine; Dilaudid  [hydromorphone]; Magnesium-containing compounds; Penicillamine; and Penicillins  History reviewed. No pertinent family history.  Social History Social History   Tobacco Use  . Smoking status: Never Smoker  . Smokeless tobacco: Never Used  Substance Use Topics  . Alcohol use: No  . Drug use: No    Review of Systems Level V caveat: Unable to obtain review of systems due to dementia    ____________________________________________   PHYSICAL EXAM:  VITAL SIGNS: ED Triage Vitals  Enc Vitals Group     BP 05/26/17 1946 (!) 145/66     Pulse Rate 05/26/17 1948 66     Resp 05/26/17 1948 16     Temp 05/26/17 1948 98.6 F (37 C)     Temp Source 05/26/17 1948 Oral     SpO2 05/26/17 1948 99 %     Weight 05/26/17 1949 190 lb (86.2 kg)     Height 05/26/17 1949 5\' 7"  (1.702 m)     Head Circumference --      Peak Flow --      Pain Score 05/26/17 1949 8     Pain Loc --      Pain Edu? --      Excl. in GC? --     Constitutional: Alert and at baseline mental status.  Relatively comfortable appearing.   Eyes: Conjunctivae are normal.  EOMI.  PERRLA. Head: Atraumatic. Nose: No congestion/rhinnorhea. Mouth/Throat: Mucous membranes are moist.   Neck: Normal range of motion.  No midline cervical spinal tenderness. Cardiovascular: Chest wall nontender.  Good peripheral circulation. Respiratory: Normal respiratory effort.  No retractions.  Gastrointestinal: Soft and nontender. No distention.  Genitourinary: No flank tenderness. Musculoskeletal: No lower extremity edema.  Extremities warm and well perfused.  Mild pain on range of motion of right hip.  No deformity.  Tenderness to dorsum of right hand and right wrist.  2+ distal pulses to all extremities.  Mild lumbar paraspinal and midline tenderness. Neurologic:  Normal speech and language.  Motor intact in all extremities.  No gross focal neurologic deficits are appreciated.  Skin:  Skin is warm and dry. No rash noted.  3 cm superficial  skin tear to dorsum of right hand Psychiatric: Mood and affect are normal. Speech and behavior are normal.  ____________________________________________   LABS (all labs ordered are listed, but only abnormal results are displayed)  Labs Reviewed - No data to display ____________________________________________  EKG   ____________________________________________  RADIOLOGY  CT head: No ICH CT C-spine: No acute fracture CT T-spine: No acute fracture CT L-spine: No acute fracture XR pelvis: No acute fracture XR right hand: No acute fracture XR right wrist: No acute fracture  ____________________________________________   PROCEDURES  Procedure(s) performed: No  Procedures  Critical Care performed: No ____________________________________________   INITIAL IMPRESSION / ASSESSMENT  AND PLAN / ED COURSE  Pertinent labs & imaging results that were available during my care of the patient were reviewed by me and considered in my medical decision making (see chart for details).  77 year old female with past medical history as noted above presents after an apparent fall at home when she was trying to move a piece of furniture and fell onto her causing her to fall backwards.  She is not sure if she hit her head, and is unable to give much specific history herself.  The fall was unwitnessed.  On exam, the vital signs are within normal limits and the patient is relatively well-appearing.  The only apparent injury is a 3 cm superficial skin tear to the dorsum of the right hand which does not require primary repair.  We will place bacitracin and a sterile dressing.  It is unclear if the patient hit her head.  She also reports some lumbar pain and bilateral hip pain.  Therefore will obtain x-rays of the right wrist and hand, CT of the head and spine, and x-ray of the pelvis.  Dissipate discharge home if no concerning acute findings on the imaging.     ----------------------------------------- 11:32 PM on 05/26/2017 -----------------------------------------  Imaging is negative.  Patient remains comfortable appearing and alert.  At baseline mental status.  I counseled the daughter on wound care for the skin tear, and on return precautions.  She expressed understanding and agreement with the discharge plan. ____________________________________________   FINAL CLINICAL IMPRESSION(S) / ED DIAGNOSES  Final diagnoses:  Skin tear of right hand without complication, initial encounter  Contusion of right hand, initial encounter  Strain of lumbar region, initial encounter      NEW MEDICATIONS STARTED DURING THIS VISIT:  Discharge Medication List as of 05/26/2017 11:03 PM       Note:  This document was prepared using Dragon voice recognition software and may include unintentional dictation errors.    Dionne Bucy, MD 05/26/17 [phone]

## 2017-05-26 NOTE — ED Triage Notes (Signed)
Pt pulled a dresser over and has lac to rt hand and pain to rt knee. Pt has dementia and is not a good historian. Daughter was not home at the time and pt called her to let her know the dresser had fallen over and she was injured

## 2017-06-11 ENCOUNTER — Emergency Department: Payer: Medicare HMO

## 2017-06-11 ENCOUNTER — Inpatient Hospital Stay
Admission: EM | Admit: 2017-06-11 | Discharge: 2017-06-14 | DRG: 470 | Disposition: A | Discharge status: Skilled Nursing Facility | Payer: Medicare HMO | Attending: Internal Medicine | Admitting: Internal Medicine

## 2017-06-11 DIAGNOSIS — E669 Obesity, unspecified: Secondary | ICD-10-CM | POA: Diagnosis present

## 2017-06-11 DIAGNOSIS — M810 Age-related osteoporosis without current pathological fracture: Secondary | ICD-10-CM | POA: Diagnosis present

## 2017-06-11 DIAGNOSIS — Z9181 History of falling: Secondary | ICD-10-CM

## 2017-06-11 DIAGNOSIS — E1122 Type 2 diabetes mellitus with diabetic chronic kidney disease: Secondary | ICD-10-CM | POA: Diagnosis present

## 2017-06-11 DIAGNOSIS — Z79890 Hormone replacement therapy (postmenopausal): Secondary | ICD-10-CM

## 2017-06-11 DIAGNOSIS — F05 Delirium due to known physiological condition: Secondary | ICD-10-CM | POA: Diagnosis not present

## 2017-06-11 DIAGNOSIS — J449 Chronic obstructive pulmonary disease, unspecified: Secondary | ICD-10-CM | POA: Diagnosis present

## 2017-06-11 DIAGNOSIS — Z419 Encounter for procedure for purposes other than remedying health state, unspecified: Secondary | ICD-10-CM

## 2017-06-11 DIAGNOSIS — Z90710 Acquired absence of both cervix and uterus: Secondary | ICD-10-CM

## 2017-06-11 DIAGNOSIS — S72009A Fracture of unspecified part of neck of unspecified femur, initial encounter for closed fracture: Secondary | ICD-10-CM | POA: Diagnosis present

## 2017-06-11 DIAGNOSIS — F329 Major depressive disorder, single episode, unspecified: Secondary | ICD-10-CM | POA: Diagnosis present

## 2017-06-11 DIAGNOSIS — S72002A Fracture of unspecified part of neck of left femur, initial encounter for closed fracture: Secondary | ICD-10-CM | POA: Diagnosis not present

## 2017-06-11 DIAGNOSIS — M1612 Unilateral primary osteoarthritis, left hip: Secondary | ICD-10-CM | POA: Diagnosis present

## 2017-06-11 DIAGNOSIS — K0889 Other specified disorders of teeth and supporting structures: Secondary | ICD-10-CM | POA: Diagnosis present

## 2017-06-11 DIAGNOSIS — G7000 Myasthenia gravis without (acute) exacerbation: Secondary | ICD-10-CM | POA: Diagnosis present

## 2017-06-11 DIAGNOSIS — I129 Hypertensive chronic kidney disease with stage 1 through stage 4 chronic kidney disease, or unspecified chronic kidney disease: Secondary | ICD-10-CM | POA: Diagnosis present

## 2017-06-11 DIAGNOSIS — Z66 Do not resuscitate: Secondary | ICD-10-CM | POA: Diagnosis present

## 2017-06-11 DIAGNOSIS — R001 Bradycardia, unspecified: Secondary | ICD-10-CM | POA: Diagnosis present

## 2017-06-11 DIAGNOSIS — N183 Chronic kidney disease, stage 3 (moderate): Secondary | ICD-10-CM | POA: Diagnosis present

## 2017-06-11 DIAGNOSIS — G8918 Other acute postprocedural pain: Secondary | ICD-10-CM

## 2017-06-11 DIAGNOSIS — G309 Alzheimer's disease, unspecified: Secondary | ICD-10-CM | POA: Diagnosis present

## 2017-06-11 DIAGNOSIS — Z888 Allergy status to other drugs, medicaments and biological substances status: Secondary | ICD-10-CM

## 2017-06-11 DIAGNOSIS — D62 Acute posthemorrhagic anemia: Secondary | ICD-10-CM | POA: Diagnosis not present

## 2017-06-11 DIAGNOSIS — Z79899 Other long term (current) drug therapy: Secondary | ICD-10-CM

## 2017-06-11 DIAGNOSIS — W19XXXA Unspecified fall, initial encounter: Secondary | ICD-10-CM | POA: Diagnosis present

## 2017-06-11 DIAGNOSIS — Z833 Family history of diabetes mellitus: Secondary | ICD-10-CM

## 2017-06-11 DIAGNOSIS — Z9049 Acquired absence of other specified parts of digestive tract: Secondary | ICD-10-CM

## 2017-06-11 DIAGNOSIS — F0280 Dementia in other diseases classified elsewhere without behavioral disturbance: Secondary | ICD-10-CM | POA: Diagnosis present

## 2017-06-11 DIAGNOSIS — E039 Hypothyroidism, unspecified: Secondary | ICD-10-CM | POA: Diagnosis present

## 2017-06-11 DIAGNOSIS — Y92039 Unspecified place in apartment as the place of occurrence of the external cause: Secondary | ICD-10-CM

## 2017-06-11 DIAGNOSIS — Z885 Allergy status to narcotic agent status: Secondary | ICD-10-CM

## 2017-06-11 DIAGNOSIS — Z6832 Body mass index (BMI) 32.0-32.9, adult: Secondary | ICD-10-CM

## 2017-06-11 DIAGNOSIS — Z7984 Long term (current) use of oral hypoglycemic drugs: Secondary | ICD-10-CM

## 2017-06-11 DIAGNOSIS — N179 Acute kidney failure, unspecified: Secondary | ICD-10-CM | POA: Diagnosis present

## 2017-06-11 DIAGNOSIS — Z880 Allergy status to penicillin: Secondary | ICD-10-CM

## 2017-06-11 MED ORDER — ONDANSETRON HCL 4 MG/2ML IJ SOLN
INTRAMUSCULAR | Status: AC
  Administered 2017-06-12: 4 mg via INTRAVENOUS
  Filled 2017-06-11: qty 2

## 2017-06-11 MED ORDER — MORPHINE SULFATE (PF) 4 MG/ML IV SOLN
INTRAVENOUS | Status: AC
  Filled 2017-06-11: qty 1

## 2017-06-11 NOTE — ED Triage Notes (Addendum)
Patient brought [facility] by [facility] from home where she lives with daughter for fall, and complaining of left hip pain. Patient states her "knees are bone on bone and they give out sometimes, that's what happened". Denies LOC or hitting head. Patient was given 75mcg fentanyl in route and 4mg  zofran. No shortening or rotation to hip.

## 2017-06-11 NOTE — ED Provider Notes (Signed)
[facility] Emergency Department Provider Note   First MD Initiated Contact with Patient 06/11/17 2249     (approximate)  I have reviewed the triage vital signs and the nursing notes.  Level 5 caveat: History limited secondary to Alzheimer's dementia.  History obtained from the patient's daughter-in-law. HISTORY  Chief Complaint Hip Pain (left)    HPI Glenda Stephens is a 77 y.o. female with below list of chronic medical conditions including myasthenia gravis, chronic bradycardia, Alzheimer's dementia presents to the emergency department following an unwitnessed fall with resultant left hip injury/pain.   Past Medical History:  Diagnosis Date  . Chronic kidney disease   . Depression   . Diabetes mellitus without complication (HCC)   . Hypertension   . Myasthenia gravis without (acute) exacerbation (HCC)   . Obesity     Patient Active Problem List   Diagnosis Date Noted  . Multifocal pneumonia 06/04/2016    Past Surgical History:  Procedure Laterality Date  . ABDOMINAL HYSTERECTOMY    . CERVICAL SPINE SURGERY    . CHOLECYSTECTOMY    . LUMBAR SPINE SURGERY      Prior to Admission medications   Medication Sig Start Date End Date Taking? Authorizing Provider  sertraline (ZOLOFT) 100 MG tablet Take 100 mg by mouth daily.   Yes [provider]  acetaminophen (TYLENOL) 325 MG tablet Take 650 mg by mouth 3 (three) times daily.    [provider]  benzonatate (TESSALON) 200 MG capsule Take 1 capsule by mouth 3 (three) times daily. 05/30/16   [provider]  busPIRone (BUSPAR) 10 MG tablet Take 20 mg by mouth 2 (two) times daily.    [provider]  calcitonin, salmon, (MIACALCIN/FORTICAL) 200 UNIT/ACT nasal spray Place 1 spray into alternate nostrils daily.    [provider]  diclofenac (VOLTAREN) 50 MG EC tablet Take 50 mg by mouth 2 (two) times daily.    [provider]  folic acid (FOLVITE) 1  MG tablet Take 1 mg by mouth daily.    [provider]  furosemide (LASIX) 20 MG tablet Take 20 mg by mouth.    [provider]  liothyronine (CYTOMEL) 25 MCG tablet Take by mouth daily.    [provider]  memantine (NAMENDA) 10 MG tablet Take 1 tablet by mouth 2 (two) times daily. 05/30/16   [provider]  Multiple Vitamin (MULTIVITAMIN) tablet Take 1 tablet by mouth daily.    [provider]  mycophenolate (CELLCEPT) 250 MG capsule Take by mouth 2 (two) times daily.    [provider]  omeprazole (PRILOSEC) 20 MG capsule Take 20 mg by mouth daily.    [provider]  oxybutynin (DITROPAN) 5 MG tablet Take 5 mg by mouth 3 (three) times daily.    [provider]  oxyCODONE (OXYCONTIN) 10 mg 12 hr tablet Take 10 mg by mouth every 12 (twelve) hours.    [provider]  potassium chloride (K-DUR) 10 MEQ tablet Take 10 mEq by mouth daily.    [provider]  pyridostigmine (MESTINON) 60 MG tablet Take 60 mg by mouth 3 (three) times daily.    [provider]  ranitidine (ZANTAC) 150 MG capsule Take 150 mg by mouth 2 (two) times daily.    [provider]  tiZANidine (ZANAFLEX) 2 MG tablet Take by mouth 3 (three) times daily as needed for muscle spasms.    [provider]  traZODone (DESYREL) 50 MG tablet  Take 50 mg by mouth at bedtime.    [provider]    Allergies Ativan [lorazepam]; Botox [botulinum toxin type a]; Codeine; Dilaudid [hydromorphone]; Magnesium-containing compounds; Penicillamine; and Penicillins  No family history on file.  Social History Social History   Tobacco Use  . Smoking status: Never Smoker  . Smokeless tobacco: Never Used  Substance Use Topics  . Alcohol use: No  . Drug use: No    Review of Systems Constitutional: No fever/chills Eyes: No visual changes. ENT: No sore throat. Cardiovascular: Denies chest pain. Respiratory: Denies  shortness of breath. Gastrointestinal: No abdominal pain.  No nausea, no vomiting.  No diarrhea.  No constipation. Genitourinary: Negative for dysuria. Musculoskeletal: Negative for neck pain.  Negative for back pain.  Positive for left hip pain Integumentary: Negative for rash. Neurological: Negative for headaches, focal weakness or numbness.   ____________________________________________   PHYSICAL EXAM:  VITAL SIGNS: ED Triage Vitals  Enc Vitals Group     BP 06/11/17 2228 (!) 146/73     Pulse Rate 06/11/17 2228 (!) 57     Resp 06/11/17 2228 17     Temp 06/11/17 2228 97.6 F (36.4 C)     Temp Source 06/11/17 2228 Oral     SpO2 06/11/17 2228 99 %     Weight 06/11/17 2242 86.2 kg (190 lb)     Height 06/11/17 2239 (P) 1.702 m (5\' 7" )     Head Circumference --      Peak Flow --      Pain Score --      Pain Loc --      Pain Edu? --      Excl. in GC? --     Constitutional: Alert and oriented.  Apparent discomfort eyes: Conjunctivae are normal. PERRL. EOMI. Head: Atraumatic. Mouth/Throat: Mucous membranes are moist. Oropharynx non-erythematous. Neck: No stridor. No cervical spine tenderness to palpation. Cardiovascular: Normal rate, regular rhythm. Good peripheral circulation. Grossly normal heart sounds. Respiratory: Normal respiratory effort.  No retractions. Lungs CTAB. Gastrointestinal: Soft and nontender. No distention.  Musculoskeletal: Left hip pain with palpation.  Left leg shortening and external rotation Neurologic:  Normal speech and language. No gross focal neurologic deficits are appreciated.  Skin:  Skin is warm, dry and intact. No rash noted. Psychiatric: Mood and affect are normal. Speech and behavior are normal.  ____________________________________________   LABS (all labs ordered are listed, but only abnormal results are displayed)  Labs Reviewed  COMPREHENSIVE METABOLIC PANEL - Abnormal; Notable for the following components:      Result Value    Glucose, Bld 118 (*)    Creatinine, Ser 1.23 (*)    GFR calc non Af Amer 41 (*)    GFR calc Af Amer 47 (*)    All other components within normal limits  CBC  TROPONIN I  TYPE AND SCREEN   _______________________________ RADIOLOGY I, Dale Dewayne Shorter, personally viewed and evaluated these images (plain radiographs) as part of my medical decision making, as well as reviewing the written report by the radiologist.  ED MD interpretation: Left femoral neck fracture  Official radiology report(s): Dg Chest 1 View  Result Date: 06/11/2017 CLINICAL DATA:  77 year old female with hip fracture. Preop evaluation. EXAM: CHEST  1 VIEW COMPARISON:  Chest radiograph dated 06/04/2016 FINDINGS: The lungs are clear. There is no pleural effusion or pneumothorax. Mild cardiomegaly. The aorta is tortuous. No acute osseous pathology. IMPRESSION: No active disease. Electronically Signed   By: Ceasar Mons.D.  On: 06/11/2017 23:37   Dg Hip Unilat With Pelvis 2-3 Views Left  Result Date: 06/11/2017 CLINICAL DATA:  77 year old female with fall and left hip pain. EXAM: DG HIP (WITH OR WITHOUT PELVIS) 2-3V LEFT COMPARISON:  None. FINDINGS: There is a transverse fracture of the left femoral neck with mild proximal migration of the femoral shaft in relation to the femoral head. There is no dislocation. The bones are osteopenic. Moderate bilateral hip arthritic changes. The soft tissues are grossly unremarkable. IMPRESSION: Fracture of the left femoral neck.  No dislocation. Electronically Signed   By: Elgie CollardArash  Radparvar M.D.   On: 06/11/2017 23:35    Procedures   ____________________________________________   INITIAL IMPRESSION / ASSESSMENT AND PLAN / ED COURSE  As part of my medical decision making, I reviewed the following data within the electronic MEDICAL RECORD NUMBER   77 year old female present with history of physical exam concern for left hip fracture which was confirmed on x-ray which revealed a left  femoral neck fracture.  Patient discussed with Dr. Martha ClanKrasinski orthopedic surgeon on-call patient discussed with Dr. Sheryle Hailiamond hospitalist for hospital admission as well ____________________________________________  FINAL CLINICAL IMPRESSION(S) / ED DIAGNOSES  Final diagnoses:  Closed displaced fracture of left femoral neck (HCC)     MEDICATIONS GIVEN DURING THIS VISIT:  Medications  morphine 4 MG/ML injection (not administered)  ondansetron (ZOFRAN) injection 4 mg (4 mg Intravenous Given 06/12/17 0000)  morphine 2 MG/ML injection 2 mg (2 mg Intravenous Given 06/12/17 0015)  morphine 4 MG/ML injection 4 mg (4 mg Intravenous Given 06/12/17 0105)     ED Discharge Orders    None       Note:  This document was prepared using Dragon voice recognition software and may include unintentional dictation errors.    Darci CurrentBrown, [place] N, MD 06/12/17 [id number]

## 2017-06-11 NOTE — ED Notes (Signed)
Lab called to collect blood specimens.

## 2017-06-12 ENCOUNTER — Inpatient Hospital Stay: Payer: Medicare HMO | Admitting: Anesthesiology

## 2017-06-12 ENCOUNTER — Encounter
Admission: EM | Disposition: A | Discharge status: Skilled Nursing Facility | Payer: Self-pay | Source: Home / Self Care | Attending: Internal Medicine

## 2017-06-12 ENCOUNTER — Encounter
Admission: RE | Admit: 2017-06-12 | Discharge: 2017-06-12 | Disposition: A | Discharge status: Home / Self Care | Payer: Medicare HMO | Source: Ambulatory Visit | Attending: Internal Medicine | Admitting: Internal Medicine

## 2017-06-12 ENCOUNTER — Inpatient Hospital Stay: Payer: Medicare HMO

## 2017-06-12 ENCOUNTER — Other Ambulatory Visit: Payer: Self-pay

## 2017-06-12 DIAGNOSIS — Z90710 Acquired absence of both cervix and uterus: Secondary | ICD-10-CM | POA: Diagnosis not present

## 2017-06-12 DIAGNOSIS — Z6832 Body mass index (BMI) 32.0-32.9, adult: Secondary | ICD-10-CM | POA: Diagnosis not present

## 2017-06-12 DIAGNOSIS — G7000 Myasthenia gravis without (acute) exacerbation: Secondary | ICD-10-CM | POA: Diagnosis present

## 2017-06-12 DIAGNOSIS — Z66 Do not resuscitate: Secondary | ICD-10-CM | POA: Diagnosis present

## 2017-06-12 DIAGNOSIS — Z9181 History of falling: Secondary | ICD-10-CM | POA: Diagnosis not present

## 2017-06-12 DIAGNOSIS — Z9049 Acquired absence of other specified parts of digestive tract: Secondary | ICD-10-CM | POA: Diagnosis not present

## 2017-06-12 DIAGNOSIS — M1612 Unilateral primary osteoarthritis, left hip: Secondary | ICD-10-CM | POA: Diagnosis present

## 2017-06-12 DIAGNOSIS — S72002A Fracture of unspecified part of neck of left femur, initial encounter for closed fracture: Secondary | ICD-10-CM | POA: Diagnosis present

## 2017-06-12 DIAGNOSIS — R001 Bradycardia, unspecified: Secondary | ICD-10-CM | POA: Diagnosis present

## 2017-06-12 DIAGNOSIS — W19XXXA Unspecified fall, initial encounter: Secondary | ICD-10-CM | POA: Diagnosis present

## 2017-06-12 DIAGNOSIS — N179 Acute kidney failure, unspecified: Secondary | ICD-10-CM | POA: Diagnosis present

## 2017-06-12 DIAGNOSIS — Z833 Family history of diabetes mellitus: Secondary | ICD-10-CM | POA: Diagnosis not present

## 2017-06-12 DIAGNOSIS — R197 Diarrhea, unspecified: Secondary | ICD-10-CM | POA: Insufficient documentation

## 2017-06-12 DIAGNOSIS — D62 Acute posthemorrhagic anemia: Secondary | ICD-10-CM | POA: Diagnosis not present

## 2017-06-12 DIAGNOSIS — Y92039 Unspecified place in apartment as the place of occurrence of the external cause: Secondary | ICD-10-CM | POA: Diagnosis not present

## 2017-06-12 DIAGNOSIS — E039 Hypothyroidism, unspecified: Secondary | ICD-10-CM | POA: Diagnosis present

## 2017-06-12 DIAGNOSIS — N183 Chronic kidney disease, stage 3 (moderate): Secondary | ICD-10-CM | POA: Diagnosis present

## 2017-06-12 DIAGNOSIS — F329 Major depressive disorder, single episode, unspecified: Secondary | ICD-10-CM | POA: Diagnosis present

## 2017-06-12 DIAGNOSIS — E669 Obesity, unspecified: Secondary | ICD-10-CM | POA: Diagnosis present

## 2017-06-12 DIAGNOSIS — K0889 Other specified disorders of teeth and supporting structures: Secondary | ICD-10-CM | POA: Diagnosis present

## 2017-06-12 DIAGNOSIS — I129 Hypertensive chronic kidney disease with stage 1 through stage 4 chronic kidney disease, or unspecified chronic kidney disease: Secondary | ICD-10-CM | POA: Diagnosis present

## 2017-06-12 DIAGNOSIS — J449 Chronic obstructive pulmonary disease, unspecified: Secondary | ICD-10-CM | POA: Diagnosis present

## 2017-06-12 DIAGNOSIS — F05 Delirium due to known physiological condition: Secondary | ICD-10-CM | POA: Diagnosis not present

## 2017-06-12 DIAGNOSIS — S72009A Fracture of unspecified part of neck of unspecified femur, initial encounter for closed fracture: Secondary | ICD-10-CM | POA: Diagnosis present

## 2017-06-12 DIAGNOSIS — G309 Alzheimer's disease, unspecified: Secondary | ICD-10-CM | POA: Diagnosis present

## 2017-06-12 DIAGNOSIS — M810 Age-related osteoporosis without current pathological fracture: Secondary | ICD-10-CM | POA: Diagnosis present

## 2017-06-12 DIAGNOSIS — E1122 Type 2 diabetes mellitus with diabetic chronic kidney disease: Secondary | ICD-10-CM | POA: Diagnosis present

## 2017-06-12 DIAGNOSIS — F0280 Dementia in other diseases classified elsewhere without behavioral disturbance: Secondary | ICD-10-CM | POA: Diagnosis present

## 2017-06-12 HISTORY — PX: TOTAL HIP ARTHROPLASTY: SHX124

## 2017-06-12 LAB — COMPREHENSIVE METABOLIC PANEL
ALBUMIN: 3.9 g/dL (ref 3.5–5.0)
ALT: 16 U/L (ref 14–54)
AST: 24 U/L (ref 15–41)
Alkaline Phosphatase: 83 U/L (ref 38–126)
Anion gap: 8 (ref 5–15)
BUN: 20 mg/dL (ref 6–20)
CO2: 24 mmol/L (ref 22–32)
CREATININE: 1.23 mg/dL — AB (ref 0.44–1.00)
Calcium: 9.3 mg/dL (ref 8.9–10.3)
Chloride: 104 mmol/L (ref 101–111)
GFR calc Af Amer: 47 mL/min — ABNORMAL LOW (ref >60)
GFR, EST NON AFRICAN AMERICAN: 41 mL/min — AB (ref >60)
GLUCOSE: 118 mg/dL — AB (ref 65–99)
POTASSIUM: 3.8 mmol/L (ref 3.5–5.1)
SODIUM: 136 mmol/L (ref 135–145)
Total Bilirubin: 0.7 mg/dL (ref 0.3–1.2)
Total Protein: 7 g/dL (ref 6.5–8.1)

## 2017-06-12 LAB — CBC
HEMATOCRIT: 41.9 % (ref 35.0–47.0)
Hemoglobin: 13.7 g/dL (ref 12.0–16.0)
MCH: 31.6 pg (ref 26.0–34.0)
MCHC: 32.6 g/dL (ref 32.0–36.0)
MCV: 96.9 fL (ref 80.0–100.0)
PLATELETS: 160 10*3/uL (ref 150–440)
RBC: 4.33 MIL/uL (ref 3.80–5.20)
RDW: 13.7 % (ref 11.5–14.5)
WBC: 5.6 10*3/uL (ref 3.6–11.0)

## 2017-06-12 LAB — SURGICAL PCR SCREEN
MRSA, PCR: NEGATIVE
Staphylococcus aureus: NEGATIVE

## 2017-06-12 LAB — GLUCOSE, CAPILLARY: Glucose-Capillary: 158 mg/dL — ABNORMAL HIGH (ref 65–99)

## 2017-06-12 LAB — URINALYSIS, ROUTINE W REFLEX MICROSCOPIC
BILIRUBIN URINE: NEGATIVE
Glucose, UA: NEGATIVE mg/dL
HGB URINE DIPSTICK: NEGATIVE
Ketones, ur: 5 mg/dL — AB
Leukocytes, UA: NEGATIVE
Nitrite: NEGATIVE
PROTEIN: NEGATIVE mg/dL
SPECIFIC GRAVITY, URINE: 1.026 (ref 1.005–1.030)
pH: 5 (ref 5.0–8.0)

## 2017-06-12 LAB — TYPE AND SCREEN
ABO/RH(D): B POS
Antibody Screen: NEGATIVE

## 2017-06-12 LAB — TROPONIN I: Troponin I: <0.03 ng/mL (ref <0.03)

## 2017-06-12 LAB — TSH: TSH: 3.514 u[IU]/mL (ref 0.350–4.500)

## 2017-06-12 LAB — HEMOGLOBIN A1C
Hgb A1c MFr Bld: 5 % (ref 4.8–5.6)
Mean Plasma Glucose: 96.8 mg/dL

## 2017-06-12 SURGERY — ARTHROPLASTY, HIP, TOTAL, ANTERIOR APPROACH
Anesthesia: Spinal | Site: Hip | Laterality: Left | Wound class: Clean

## 2017-06-12 MED ORDER — SERTRALINE HCL 50 MG PO TABS
100.0000 mg | ORAL_TABLET | Freq: Every day | ORAL | Status: DC
Start: 2017-06-12 — End: 2017-06-14
  Administered 2017-06-13 – 2017-06-14 (×2): 100 mg via ORAL
  Filled 2017-06-12 (×2): qty 2

## 2017-06-12 MED ORDER — CALCITONIN (SALMON) 200 UNIT/ACT NA SOLN
1.0000 | Freq: Every day | NASAL | Status: DC
Start: 2017-06-12 — End: 2017-06-14
  Filled 2017-06-12: qty 3.7

## 2017-06-12 MED ORDER — PROPOFOL 10 MG/ML IV BOLUS
INTRAVENOUS | Status: DC | PRN
  Administered 2017-06-12: 80 mg via INTRAVENOUS

## 2017-06-12 MED ORDER — SODIUM CHLORIDE 0.9 % IV SOLN
INTRAVENOUS | Status: DC
  Administered 2017-06-12 (×2): via INTRAVENOUS

## 2017-06-12 MED ORDER — FAMOTIDINE 20 MG PO TABS
20.0000 mg | ORAL_TABLET | Freq: Two times a day (BID) | ORAL | Status: DC
  Administered 2017-06-12 – 2017-06-14 (×3): 20 mg via ORAL
  Filled 2017-06-12 (×4): qty 1

## 2017-06-12 MED ORDER — FENTANYL CITRATE (PF) 100 MCG/2ML IJ SOLN
50.0000 ug | Freq: Once | INTRAMUSCULAR | Status: AC
  Administered 2017-06-12: 50 ug via INTRAVENOUS

## 2017-06-12 MED ORDER — LIOTHYRONINE SODIUM 25 MCG PO TABS
25.0000 ug | ORAL_TABLET | Freq: Every day | ORAL | Status: DC
Start: 2017-06-12 — End: 2017-06-14
  Administered 2017-06-13 – 2017-06-14 (×2): 25 ug via ORAL
  Filled 2017-06-12 (×3): qty 1

## 2017-06-12 MED ORDER — DICLOFENAC SODIUM 50 MG PO TBEC: 50.0000 mg | DELAYED_RELEASE_TABLET | Freq: Two times a day (BID) | ORAL | Status: DC

## 2017-06-12 MED ORDER — MORPHINE SULFATE (PF) 2 MG/ML IV SOLN
INTRAVENOUS | Status: AC
  Filled 2017-06-12: qty 1

## 2017-06-12 MED ORDER — MORPHINE SULFATE (PF) 4 MG/ML IV SOLN
INTRAVENOUS | Status: AC
  Filled 2017-06-12: qty 1

## 2017-06-12 MED ORDER — ONDANSETRON HCL 4 MG/2ML IJ SOLN
INTRAMUSCULAR | Status: DC | PRN
  Administered 2017-06-12: 4 mg via INTRAVENOUS

## 2017-06-12 MED ORDER — PROPOFOL 500 MG/50ML IV EMUL
INTRAVENOUS | Status: AC
  Filled 2017-06-12: qty 50

## 2017-06-12 MED ORDER — EPHEDRINE SULFATE 50 MG/ML IJ SOLN
INTRAMUSCULAR | Status: AC
  Filled 2017-06-12: qty 1

## 2017-06-12 MED ORDER — MYCOPHENOLATE MOFETIL 250 MG PO CAPS: 1000.0000 mg | ORAL_CAPSULE | ORAL | Status: DC

## 2017-06-12 MED ORDER — ENOXAPARIN SODIUM 40 MG/0.4ML ~~LOC~~ SOLN
40.0000 mg | SUBCUTANEOUS | Status: DC
  Administered 2017-06-13 – 2017-06-14 (×2): 40 mg via SUBCUTANEOUS
  Filled 2017-06-12 (×2): qty 0.4

## 2017-06-12 MED ORDER — ONDANSETRON HCL 4 MG PO TABS: 4.0000 mg | ORAL_TABLET | Freq: Four times a day (QID) | ORAL | Status: DC | PRN

## 2017-06-12 MED ORDER — PANTOPRAZOLE SODIUM 40 MG PO TBEC
40.0000 mg | DELAYED_RELEASE_TABLET | Freq: Every day | ORAL | Status: DC
  Administered 2017-06-13: 40 mg via ORAL
  Filled 2017-06-12: qty 1

## 2017-06-12 MED ORDER — CLINDAMYCIN PHOSPHATE 900 MG/50ML IV SOLN
900.0000 mg | Freq: Once | INTRAVENOUS | Status: AC
  Administered 2017-06-12: 900 mg via INTRAVENOUS
  Filled 2017-06-12 (×2): qty 50

## 2017-06-12 MED ORDER — DIPHENHYDRAMINE HCL 12.5 MG/5ML PO ELIX: 12.5000 mg | ORAL_SOLUTION | ORAL | Status: DC | PRN

## 2017-06-12 MED ORDER — DEXAMETHASONE SODIUM PHOSPHATE 10 MG/ML IJ SOLN
INTRAMUSCULAR | Status: AC
  Filled 2017-06-12: qty 1

## 2017-06-12 MED ORDER — ZOLPIDEM TARTRATE 5 MG PO TABS: 5.0000 mg | ORAL_TABLET | Freq: Every evening | ORAL | Status: DC | PRN

## 2017-06-12 MED ORDER — EPHEDRINE SULFATE 50 MG/ML IJ SOLN
INTRAMUSCULAR | Status: DC | PRN
  Administered 2017-06-12: 10 mg via INTRAVENOUS
  Administered 2017-06-12: 15 mg via INTRAVENOUS
  Administered 2017-06-12: 10 mg via INTRAVENOUS

## 2017-06-12 MED ORDER — PHENOL 1.4 % MT LIQD
1.0000 | OROMUCOSAL | Status: DC | PRN
  Filled 2017-06-12: qty 177

## 2017-06-12 MED ORDER — NEOMYCIN-POLYMYXIN B GU 40-200000 IR SOLN
Status: DC | PRN
  Administered 2017-06-12: 4 mL

## 2017-06-12 MED ORDER — MYCOPHENOLATE MOFETIL 250 MG PO CAPS
1000.0000 mg | ORAL_CAPSULE | Freq: Two times a day (BID) | ORAL | Status: DC
  Administered 2017-06-12 – 2017-06-14 (×3): 1000 mg via ORAL
  Filled 2017-06-12 (×8): qty 4

## 2017-06-12 MED ORDER — FENTANYL CITRATE (PF) 100 MCG/2ML IJ SOLN
INTRAMUSCULAR | Status: DC | PRN
  Administered 2017-06-12: 25 ug via INTRAVENOUS
  Administered 2017-06-12: 50 ug via INTRAVENOUS
  Administered 2017-06-12: 25 ug via INTRAVENOUS

## 2017-06-12 MED ORDER — LACTATED RINGERS IV SOLN
INTRAVENOUS | Status: DC | PRN
  Administered 2017-06-12: 16:00:00 via INTRAVENOUS

## 2017-06-12 MED ORDER — FENTANYL CITRATE (PF) 100 MCG/2ML IJ SOLN
INTRAMUSCULAR | Status: AC
  Filled 2017-06-12: qty 2

## 2017-06-12 MED ORDER — ACETAMINOPHEN 325 MG PO TABS
650.0000 mg | ORAL_TABLET | Freq: Three times a day (TID) | ORAL | Status: DC
  Administered 2017-06-12 – 2017-06-14 (×4): 650 mg via ORAL
  Filled 2017-06-12 (×5): qty 2

## 2017-06-12 MED ORDER — ROCURONIUM BROMIDE 100 MG/10ML IV SOLN
INTRAVENOUS | Status: DC | PRN
  Administered 2017-06-12: 30 mg via INTRAVENOUS
  Administered 2017-06-12: 10 mg via INTRAVENOUS

## 2017-06-12 MED ORDER — ACETAMINOPHEN 325 MG PO TABS
650.0000 mg | ORAL_TABLET | Freq: Four times a day (QID) | ORAL | Status: DC | PRN
  Administered 2017-06-14: 650 mg via ORAL

## 2017-06-12 MED ORDER — BENZONATATE 100 MG PO CAPS
200.0000 mg | ORAL_CAPSULE | Freq: Three times a day (TID) | ORAL | Status: DC
  Administered 2017-06-12 – 2017-06-14 (×4): 200 mg via ORAL
  Filled 2017-06-12 (×4): qty 2

## 2017-06-12 MED ORDER — DOCUSATE SODIUM 100 MG PO CAPS
100.0000 mg | ORAL_CAPSULE | Freq: Two times a day (BID) | ORAL | Status: DC
  Administered 2017-06-12 – 2017-06-14 (×3): 100 mg via ORAL
  Filled 2017-06-12 (×4): qty 1

## 2017-06-12 MED ORDER — ONDANSETRON HCL 4 MG/2ML IJ SOLN: 4.0000 mg | Freq: Once | INTRAMUSCULAR | Status: DC | PRN

## 2017-06-12 MED ORDER — TIZANIDINE HCL 2 MG PO TABS
2.0000 mg | ORAL_TABLET | Freq: Three times a day (TID) | ORAL | Status: DC | PRN
  Filled 2017-06-12: qty 1

## 2017-06-12 MED ORDER — DEXAMETHASONE SODIUM PHOSPHATE 10 MG/ML IJ SOLN
INTRAMUSCULAR | Status: DC | PRN
  Administered 2017-06-12: 10 mg via INTRAVENOUS

## 2017-06-12 MED ORDER — MORPHINE SULFATE (PF) 4 MG/ML IV SOLN
4.0000 mg | Freq: Once | INTRAVENOUS | Status: AC
  Administered 2017-06-12: 4 mg via INTRAVENOUS

## 2017-06-12 MED ORDER — FOLIC ACID 1 MG PO TABS
1.0000 mg | ORAL_TABLET | Freq: Every day | ORAL | Status: DC
  Administered 2017-06-13 – 2017-06-14 (×2): 1 mg via ORAL
  Filled 2017-06-12 (×2): qty 1

## 2017-06-12 MED ORDER — OXYBUTYNIN CHLORIDE 5 MG PO TABS
5.0000 mg | ORAL_TABLET | Freq: Three times a day (TID) | ORAL | Status: DC
  Administered 2017-06-12 – 2017-06-14 (×4): 5 mg via ORAL
  Filled 2017-06-12 (×9): qty 1

## 2017-06-12 MED ORDER — MELATONIN 5 MG PO TABS
5.0000 mg | ORAL_TABLET | Freq: Every day | ORAL | Status: DC
  Administered 2017-06-12: 5 mg via ORAL
  Filled 2017-06-12 (×3): qty 1

## 2017-06-12 MED ORDER — HEPARIN SODIUM (PORCINE) 5000 UNIT/ML IJ SOLN: 5000.0000 [IU] | Freq: Three times a day (TID) | INTRAMUSCULAR | Status: DC

## 2017-06-12 MED ORDER — LIDOCAINE HCL (CARDIAC) 20 MG/ML IV SOLN
INTRAVENOUS | Status: DC | PRN
  Administered 2017-06-12: 100 mg via INTRAVENOUS

## 2017-06-12 MED ORDER — FUROSEMIDE 20 MG PO TABS
20.0000 mg | ORAL_TABLET | Freq: Every day | ORAL | Status: DC
  Administered 2017-06-13 – 2017-06-14 (×2): 20 mg via ORAL
  Filled 2017-06-12 (×2): qty 1

## 2017-06-12 MED ORDER — FLEET ENEMA 7-19 GM/118ML RE ENEM: 1.0000 | ENEMA | Freq: Once | RECTAL | Status: DC | PRN

## 2017-06-12 MED ORDER — SENNOSIDES-DOCUSATE SODIUM 8.6-50 MG PO TABS: 1.0000 | ORAL_TABLET | Freq: Every evening | ORAL | Status: DC | PRN

## 2017-06-12 MED ORDER — FENTANYL CITRATE (PF) 100 MCG/2ML IJ SOLN: 25.0000 ug | INTRAMUSCULAR | Status: DC | PRN

## 2017-06-12 MED ORDER — ACETAMINOPHEN 650 MG RE SUPP: 650.0000 mg | Freq: Four times a day (QID) | RECTAL | Status: DC | PRN

## 2017-06-12 MED ORDER — BUPIVACAINE HCL (PF) 0.5 % IJ SOLN
INTRAMUSCULAR | Status: AC
  Filled 2017-06-12: qty 10

## 2017-06-12 MED ORDER — TRAZODONE HCL 50 MG PO TABS
50.0000 mg | ORAL_TABLET | Freq: Every day | ORAL | Status: DC
  Administered 2017-06-12: 50 mg via ORAL
  Filled 2017-06-12 (×2): qty 1

## 2017-06-12 MED ORDER — PHENYLEPHRINE HCL 10 MG/ML IJ SOLN
INTRAMUSCULAR | Status: DC | PRN
  Administered 2017-06-12 (×2): 100 ug via INTRAVENOUS

## 2017-06-12 MED ORDER — SODIUM CHLORIDE 0.9 % IV SOLN
INTRAVENOUS | Status: DC
  Administered 2017-06-12 – 2017-06-13 (×2): via INTRAVENOUS

## 2017-06-12 MED ORDER — DOCUSATE SODIUM 100 MG PO CAPS: 100.0000 mg | ORAL_CAPSULE | Freq: Two times a day (BID) | ORAL | Status: DC

## 2017-06-12 MED ORDER — ONDANSETRON HCL 4 MG/2ML IJ SOLN: 4.0000 mg | Freq: Four times a day (QID) | INTRAMUSCULAR | Status: DC | PRN

## 2017-06-12 MED ORDER — TRANEXAMIC ACID 1000 MG/10ML IV SOLN
1000.0000 mg | Freq: Once | INTRAVENOUS | Status: AC
  Administered 2017-06-12: 1000 mg via INTRAVENOUS
  Filled 2017-06-12: qty 10

## 2017-06-12 MED ORDER — CLINDAMYCIN PHOSPHATE 900 MG/50ML IV SOLN
900.0000 mg | Freq: Four times a day (QID) | INTRAVENOUS | Status: AC
  Administered 2017-06-12 – 2017-06-13 (×4): 900 mg via INTRAVENOUS
  Filled 2017-06-12 (×4): qty 50

## 2017-06-12 MED ORDER — MEMANTINE HCL 5 MG PO TABS
10.0000 mg | ORAL_TABLET | Freq: Two times a day (BID) | ORAL | Status: DC
  Administered 2017-06-12 – 2017-06-14 (×3): 10 mg via ORAL
  Filled 2017-06-12 (×4): qty 2

## 2017-06-12 MED ORDER — SUGAMMADEX SODIUM 500 MG/5ML IV SOLN
INTRAVENOUS | Status: DC | PRN
  Administered 2017-06-12: 200 mg via INTRAVENOUS

## 2017-06-12 MED ORDER — METOCLOPRAMIDE HCL 5 MG/ML IJ SOLN: 5.0000 mg | Freq: Three times a day (TID) | INTRAMUSCULAR | Status: DC | PRN

## 2017-06-12 MED ORDER — METOCLOPRAMIDE HCL 10 MG PO TABS: 5.0000 mg | ORAL_TABLET | Freq: Three times a day (TID) | ORAL | Status: DC | PRN

## 2017-06-12 MED ORDER — ONDANSETRON HCL 4 MG/2ML IJ SOLN
INTRAMUSCULAR | Status: AC
  Filled 2017-06-12: qty 2

## 2017-06-12 MED ORDER — ONDANSETRON HCL 4 MG/2ML IJ SOLN
4.0000 mg | Freq: Once | INTRAMUSCULAR | Status: AC
  Administered 2017-06-12: 4 mg via INTRAVENOUS

## 2017-06-12 MED ORDER — MORPHINE SULFATE (PF) 2 MG/ML IV SOLN
2.0000 mg | INTRAVENOUS | Status: DC | PRN
  Administered 2017-06-12 (×2): 2 mg via INTRAVENOUS
  Filled 2017-06-12 (×2): qty 1

## 2017-06-12 MED ORDER — ROCURONIUM BROMIDE 50 MG/5ML IV SOLN
INTRAVENOUS | Status: AC
  Filled 2017-06-12: qty 1

## 2017-06-12 MED ORDER — LIDOCAINE HCL (PF) 2 % IJ SOLN
INTRAMUSCULAR | Status: AC
  Filled 2017-06-12: qty 10

## 2017-06-12 MED ORDER — MENTHOL 3 MG MT LOZG
1.0000 | LOZENGE | OROMUCOSAL | Status: DC | PRN
  Filled 2017-06-12: qty 9

## 2017-06-12 MED ORDER — BISACODYL 10 MG RE SUPP: 10.0000 mg | Freq: Every day | RECTAL | Status: DC | PRN

## 2017-06-12 MED ORDER — ADULT MULTIVITAMIN W/MINERALS CH
1.0000 | ORAL_TABLET | Freq: Every day | ORAL | Status: DC
Start: 2017-06-12 — End: 2017-06-14
  Administered 2017-06-13 – 2017-06-14 (×2): 1 via ORAL
  Filled 2017-06-12 (×2): qty 1

## 2017-06-12 MED ORDER — PYRIDOSTIGMINE BROMIDE 60 MG PO TABS
60.0000 mg | ORAL_TABLET | Freq: Three times a day (TID) | ORAL | Status: DC
  Administered 2017-06-12 – 2017-06-14 (×4): 60 mg via ORAL
  Filled 2017-06-12 (×9): qty 1

## 2017-06-12 MED ORDER — OXYCODONE-ACETAMINOPHEN 5-325 MG PO TABS: 1.0000 | ORAL_TABLET | Freq: Four times a day (QID) | ORAL | Status: DC | PRN

## 2017-06-12 MED ORDER — FENTANYL CITRATE (PF) 100 MCG/2ML IJ SOLN
INTRAMUSCULAR | Status: AC
  Administered 2017-06-12: 50 ug via INTRAVENOUS
  Filled 2017-06-12: qty 2

## 2017-06-12 MED ORDER — MORPHINE SULFATE (PF) 2 MG/ML IV SOLN
2.0000 mg | Freq: Once | INTRAVENOUS | Status: AC
Start: 2017-06-12 — End: 2017-06-12
  Administered 2017-06-12: 2 mg via INTRAVENOUS

## 2017-06-12 MED ORDER — BUSPIRONE HCL 10 MG PO TABS
20.0000 mg | ORAL_TABLET | Freq: Two times a day (BID) | ORAL | Status: DC
  Administered 2017-06-12 – 2017-06-14 (×3): 20 mg via ORAL
  Filled 2017-06-12 (×4): qty 2

## 2017-06-12 MED ORDER — MORPHINE SULFATE (PF) 2 MG/ML IV SOLN
2.0000 mg | Freq: Once | INTRAVENOUS | Status: AC
  Administered 2017-06-12: 2 mg via INTRAVENOUS

## 2017-06-12 MED ORDER — POTASSIUM CHLORIDE ER 10 MEQ PO TBCR
10.0000 meq | EXTENDED_RELEASE_TABLET | Freq: Every day | ORAL | Status: DC
  Administered 2017-06-13 – 2017-06-14 (×2): 10 meq via ORAL
  Filled 2017-06-12 (×5): qty 1

## 2017-06-12 SURGICAL SUPPLY — 55 items
BLADE SAW SAG 18.5X105 (BLADE) ×3 IMPLANT
BNDG COHESIVE 6X5 TAN STRL LF (GAUZE/BANDAGES/DRESSINGS) ×9 IMPLANT
CANISTER SUCT 1200ML W/VALVE (MISCELLANEOUS) ×3 IMPLANT
CHLORAPREP W/TINT 26ML (MISCELLANEOUS) ×3 IMPLANT
DRAPE C-ARM XRAY 36X54 (DRAPES) ×3 IMPLANT
DRAPE INCISE IOBAN 66X60 STRL (DRAPES) IMPLANT
DRAPE POUCH INSTRU U-SHP 10X18 (DRAPES) ×3 IMPLANT
DRAPE SHEET LG 3/4 BI-LAMINATE (DRAPES) ×9 IMPLANT
DRAPE TABLE BACK 80X90 (DRAPES) ×3 IMPLANT
DRESSING SURGICEL FIBRLLR 1X2 (HEMOSTASIS) ×2 IMPLANT
DRSG OPSITE POSTOP 4X8 (GAUZE/BANDAGES/DRESSINGS) ×6 IMPLANT
DRSG SURGICEL FIBRILLAR 1X2 (HEMOSTASIS) ×6
ELECT BLADE 6.5 EXT (BLADE) ×3 IMPLANT
ELECT REM PT RETURN 9FT ADLT (ELECTROSURGICAL) ×3
ELECTRODE REM PT RTRN 9FT ADLT (ELECTROSURGICAL) ×1 IMPLANT
EVACUATOR 1/8 PVC DRAIN (DRAIN) IMPLANT
GLOVE BIOGEL PI IND STRL 9 (GLOVE) ×1 IMPLANT
GLOVE BIOGEL PI INDICATOR 9 (GLOVE) ×2
GLOVE SURG SYN 9.0  PF PI (GLOVE) ×4
GLOVE SURG SYN 9.0 PF PI (GLOVE) ×2 IMPLANT
GOWN SRG 2XL LVL 4 RGLN SLV (GOWNS) ×1 IMPLANT
GOWN STRL NON-REIN 2XL LVL4 (GOWNS) ×2
GOWN STRL REUS W/ TWL LRG LVL3 (GOWN DISPOSABLE) ×1 IMPLANT
GOWN STRL REUS W/TWL LRG LVL3 (GOWN DISPOSABLE) ×2
HIP DBL LINER 54X28 (Liner) ×3 IMPLANT
HIP FEM HD M 28 (Head) ×3 IMPLANT
HOLDER FOLEY CATH W/STRAP (MISCELLANEOUS) ×3 IMPLANT
HOOD PEEL AWAY FLYTE STAYCOOL (MISCELLANEOUS) ×3 IMPLANT
KIT PREVENA INCISION MGT 13 (CANNISTER) ×3 IMPLANT
MAT BLUE FLOOR 46X72 FLO (MISCELLANEOUS) ×3 IMPLANT
NDL SAFETY ECLIPSE 18X1.5 (NEEDLE) ×1 IMPLANT
NEEDLE HYPO 18GX1.5 SHARP (NEEDLE) ×2
NEEDLE SPNL 18GX3.5 QUINCKE PK (NEEDLE) ×3 IMPLANT
NS IRRIG 1000ML POUR BTL (IV SOLUTION) ×3 IMPLANT
PACK HIP COMPR (MISCELLANEOUS) ×3 IMPLANT
SHELL ACETABULAR SZ 54 DM (Shell) ×3 IMPLANT
SOL PREP PVP 2OZ (MISCELLANEOUS) ×3
SOLUTION PREP PVP 2OZ (MISCELLANEOUS) ×1 IMPLANT
SPONGE DRAIN TRACH 4X4 STRL 2S (GAUZE/BANDAGES/DRESSINGS) ×3 IMPLANT
STAPLER SKIN PROX 35W (STAPLE) ×3 IMPLANT
STEM FEMORAL SZ8 STD COLLARED (Stem) ×3 IMPLANT
STRAP SAFETY 5IN WIDE (MISCELLANEOUS) ×3 IMPLANT
SUT DVC 2 QUILL PDO  T11 36X36 (SUTURE) ×2
SUT DVC 2 QUILL PDO T11 36X36 (SUTURE) ×1 IMPLANT
SUT SILK 0 (SUTURE) ×2
SUT SILK 0 30XBRD TIE 6 (SUTURE) ×1 IMPLANT
SUT V-LOC 90 ABS DVC 3-0 CL (SUTURE) ×3 IMPLANT
SUT VIC AB 1 CT1 36 (SUTURE) ×3 IMPLANT
SYR 20CC LL (SYRINGE) ×3 IMPLANT
SYR 30ML LL (SYRINGE) ×3 IMPLANT
SYR BULB IRRIG 60ML STRL (SYRINGE) ×3 IMPLANT
TAPE MICROFOAM 4IN (TAPE) ×3 IMPLANT
TOWEL OR 17X26 4PK STRL BLUE (TOWEL DISPOSABLE) ×3 IMPLANT
TRAY FOLEY W/METER SILVER 16FR (SET/KITS/TRAYS/PACK) ×3 IMPLANT
WND VAC CANISTER 500ML (MISCELLANEOUS) ×3 IMPLANT

## 2017-06-12 NOTE — Anesthesia Preprocedure Evaluation (Addendum)
Anesthesia Evaluation  Patient identified by MRN, date of birth, ID band Patient awake    Reviewed: Allergy & Precautions, NPO status , Patient's Chart, lab work & pertinent test results  Airway Mallampati: III  TM Distance: <3 FB     Dental  (+) Chipped, Caps   Pulmonary pneumonia, resolved,    Pulmonary exam normal        Cardiovascular hypertension, Normal cardiovascular exam     Neuro/Psych PSYCHIATRIC DISORDERS Depression  Neuromuscular disease    GI/Hepatic Neg liver ROS,   Endo/Other  diabetes  Renal/GU Renal InsufficiencyRenal disease  negative genitourinary   Musculoskeletal   Abdominal Normal abdominal exam  (+)   Peds negative pediatric ROS (+)  Hematology negative hematology ROS (+)   Anesthesia Other Findings Past Medical History: No date: Chronic kidney disease No date: Depression No date: Diabetes mellitus without complication (HCC) No date: Hypertension No date: Myasthenia gravis without (acute) exacerbation (HCC) No date: Obesity  Reproductive/Obstetrics                            Anesthesia Physical Anesthesia Plan  ASA: III  Anesthesia Plan: General   Post-op Pain Management:    Induction:   PONV Risk Score and Plan:   Airway Management Planned: Oral ETT  Additional Equipment:   Intra-op Plan:   Post-operative Plan:   Informed Consent: I have reviewed the patients History and Physical, chart, labs and discussed the procedure including the risks, benefits and alternatives for the proposed anesthesia with the patient or authorized representative who has indicated his/her understanding and acceptance.   Dental advisory given  Plan Discussed with: CRNA and Surgeon  Anesthesia Plan Comments: (Talked with daughter and she understands risks of the procedure including possible postop ventilation.)       Anesthesia Quick Evaluation

## 2017-06-12 NOTE — Clinical Social Work Note (Signed)
Clinical Social Work Assessment  Patient Details  Name: Glenda Stephens MRN: [id number] Date of Birth: 11/20/1940  Date of referral:  06/12/17               Reason for consult:  Facility Placement                Permission sought to share information with:  Chartered certified accountant granted to share information::  Yes, Verbal Permission Granted  Name::      [place]::   [place]   Relationship::     Contact Information:     Housing/Transportation Living arrangements for the past 2 months:  [place] of Information:  Patient, Adult Children Patient Interpreter Needed:  None Criminal Activity/Legal Involvement Pertinent to Current Situation/Hospitalization:  No - Comment as needed Significant Relationships:  Adult Children Lives with:  Adult Children Do you feel safe going back to the place where you live?  Yes Need for family participation in patient care:  Yes (Comment)  Care giving concerns:  Patient lives in [place] with her daughter Glenda Stephens.    Social Worker assessment / plan:  Holiday representative ([address]) reviewed chart and noted that patient has a hip fracture. Surgery and PT are pending. CSW met with patient and her daughter Glenda Stephens [phone] was at bedside. Patient was pleasant and oriented X3 and was laying in the bed. CSW introduced self and explained role of CSW department. Per daughter Glenda Stephens patient lives in [place] with her other daughter Glenda Stephens. Per Glenda Stephens Dr. Mack Guise was the orthopedic surgeon on call however they requested Dr. Marry Guan but his schedule was full. Per Glenda Stephens, Dr. Rudene Christians will do surgery at 3 pm today. Per Glenda Stephens, Dr. Rudene Christians did another family member's surgery and they are familiar with him. CSW explained that PT will evaluate patient after surgery and make a recommendation of home health or SNF. CSW explained that [facility] will have to approve SNF. Patient and her daughter Glenda Stephens verbalized their  understanding and prefer SNF. Glenda Stephens requested [facility] and is okay with a semi-private room. FL2 complete and faxed out. CSW will continue to follow and assist as needed.   CSW presented bed offers to [place] and she chose [facility] Per [facility] admissions coordinator at [facility] she will start [facility] authorization today. CSW will continue to follow and assist as needed.   Employment status:  Retired Nurse, adult PT Recommendations:  Not assessed at this time [place] / Referral to community resources:  [place]  Patient/Family's Response to care:  Patient's daughter Glenda Stephens chose [facility] [employer]   Patient/Family's Understanding of and Emotional Response to Diagnosis, Current Treatment, and Prognosis:  Patient and her daughter were very pleasant and thanked CSW for assistance.   Emotional Assessment Appearance:  Appears stated age Attitude/Demeanor/Rapport:    Affect (typically observed):  Pleasant Orientation:  Oriented to Self, Oriented to Place, Oriented to  Time, Fluctuating Orientation (Suspected and/or reported Sundowners), Oriented to Situation Alcohol / Substance use:  Not Applicable Psych involvement (Current and /or in the community):  No (Comment)  Discharge Needs  Concerns to be addressed:  Discharge Planning Concerns Readmission within the last 30 days:  No Current discharge risk:  Dependent with Mobility Barriers to Discharge:  Continued Medical Work up   [employer], Veronia Beets, LCSW 06/12/2017, 10:40 AM

## 2017-06-12 NOTE — Progress Notes (Signed)
77 year old female with past medical history significant for hypertension, diabetes, COPD, myasthenia gravis who is living at an apartment with family, brought in after a fall and left femoral neck fracture Seen by my colleague this morning and cleared for surgery. Moderate risk for surgery. Updated daughter again at bedside this morning. -Pain control, physical therapy, DVT prophylaxis after surgery..Marland Kitchen

## 2017-06-12 NOTE — Progress Notes (Signed)
   [employer] - [place] [facility]   Advance care planning  Hospital Day: 0 days Glenda Stephens is a 77 y.o. female presenting with Hip Pain (left) .   Advance care planning discussed with patient and her daughter at bedside. Patient has dementia and also received pain medications- so unable to participate in conversation. Daughter is her health care power of attorney. All questions in regards to overall condition and expected prognosis answered. The decision was made to change current code status  CODE STATUS: DNR Time spent: 18 minutes

## 2017-06-12 NOTE — [place] FL2 (Signed)
[place] MEDICAID FL2 LEVEL OF CARE SCREENING TOOL     IDENTIFICATION  Patient Name: Glenda Stephens Birthdate: [birth year] Sex: female Admission Date (Current Location): 06/11/2017  [place]   Facility and Address:  [facility], [address], [place], [place] [address]      Provider Number: [id number]  Attending Physician Name and Address:  Enid BaasKalisetti, Radhika, MD  Relative Name and Phone Number:       Current Level of Care: Hospital Recommended Level of Care: Skilled Nursing Facility Prior Approval Number:    Date Approved/Denied:   PASRR Number: [id number] [id number])  Discharge Plan: SNF    Current Diagnoses: Patient Active Problem List   Diagnosis Date Noted  . Hip fracture (HCC) 06/12/2017  . Multifocal pneumonia 06/04/2016    Orientation RESPIRATION BLADDER Height & Weight     Self, Time, Situation, Place  Normal Continent Weight: 227 lb (103 kg) Height:  5\' 10"  (177.8 cm)  BEHAVIORAL SYMPTOMS/MOOD NEUROLOGICAL BOWEL NUTRITION STATUS      Continent Diet(Regular Diet. )  AMBULATORY STATUS COMMUNICATION OF NEEDS Skin   Extensive Assist Verbally Surgical wounds                       Personal Care Assistance Level of Assistance  Bathing, Feeding, Dressing Bathing Assistance: Limited assistance Feeding assistance: Independent Dressing Assistance: Limited assistance     Functional Limitations Info  Sight, Hearing, Speech Sight Info: Adequate Hearing Info: Impaired Speech Info: Adequate    SPECIAL CARE FACTORS FREQUENCY  PT (By licensed PT), OT (By licensed OT)     PT Frequency: (5) OT Frequency: (5)            Contractures      Additional Factors Info  Code Status, Allergies Code Status Info: (Full Code. ) Allergies Info: (Ativan Lorazepam, Botox Botulinum Toxin Type A, Codeine, Dilaudid Hydromorphone, Magnesium-containing Compounds, Penicillamine, Penicillins)           Current  Medications (06/12/2017):  This is the current hospital active medication list Current Facility-Administered Medications  Medication Dose Route Frequency Provider Last Rate Last Dose  . 0.9 %  sodium chloride infusion   Intravenous Continuous Arnaldo Nataliamond, Michael S, MD 125 mL/hr at 06/12/17 0417    . acetaminophen (TYLENOL) tablet 650 mg  650 mg Oral Q6H PRN Arnaldo Nataliamond, Michael S, MD       Or  . acetaminophen (TYLENOL) suppository 650 mg  650 mg Rectal Q6H PRN Arnaldo Nataliamond, Michael S, MD      . acetaminophen (TYLENOL) tablet 650 mg  650 mg Oral TID Arnaldo Nataliamond, Michael S, MD      . benzonatate (TESSALON) capsule 200 mg  200 mg Oral TID Arnaldo Nataliamond, Michael S, MD      . busPIRone (BUSPAR) tablet 20 mg  20 mg Oral BID Arnaldo Nataliamond, Michael S, MD      . calcitonin (salmon) (MIACALCIN/FORTICAL) nasal spray 1 spray  1 spray Alternating Nares Daily Arnaldo Nataliamond, Michael S, MD      . docusate sodium (COLACE) capsule 100 mg  100 mg Oral BID Arnaldo Nataliamond, Michael S, MD      . famotidine (PEPCID) tablet 20 mg  20 mg Oral BID Arnaldo Nataliamond, Michael S, MD      . folic acid (FOLVITE) tablet 1 mg  1 mg Oral Daily Arnaldo Nataliamond, Michael S, MD      . furosemide (LASIX) tablet 20 mg  20 mg Oral Daily Arnaldo Nataliamond, Michael S, MD      .  liothyronine (CYTOMEL) tablet 25 mcg  25 mcg Oral Daily Arnaldo Natal, MD      . memantine [facility]) tablet 10 mg  10 mg Oral BID Arnaldo Natal, MD      . morphine 2 MG/ML injection 2 mg  2 mg Intravenous Q4H PRN Arnaldo Natal, MD   2 mg at 06/12/17 0420  . multivitamin with minerals tablet 1 tablet  1 tablet Oral Daily Arnaldo Natal, MD      . mycophenolate (CELLCEPT) capsule 1,000 mg  1,000 mg Oral BID Arnaldo Natal, MD      . ondansetron [facility]) tablet 4 mg  4 mg Oral Q6H PRN Arnaldo Natal, MD       Or  . ondansetron [facility]) injection 4 mg  4 mg Intravenous Q6H PRN Arnaldo Natal, MD      . oxybutynin (DITROPAN) tablet 5 mg  5 mg Oral TID Arnaldo Natal, MD      . oxyCODONE-acetaminophen  (PERCOCET/ROXICET) 5-325 MG per tablet 1 tablet  1 tablet Oral Q6H PRN Arnaldo Natal, MD      . pantoprazole (PROTONIX) EC tablet 40 mg  40 mg Oral Daily Arnaldo Natal, MD      . potassium chloride (K-DUR) CR tablet 10 mEq  10 mEq Oral Daily Arnaldo Natal, MD      . pyridostigmine (MESTINON) tablet 60 mg  60 mg Oral TID Arnaldo Natal, MD      . sertraline (ZOLOFT) tablet 100 mg  100 mg Oral Daily Arnaldo Natal, MD      . tiZANidine (ZANAFLEX) tablet 2 mg  2 mg Oral TID PRN Arnaldo Natal, MD      . traZODone (DESYREL) tablet 50 mg  50 mg Oral QHS Arnaldo Natal, MD         Discharge Medications: Please see discharge summary for a list of discharge medications.  Relevant Imaging Results:  Relevant Lab Results:   Additional Information (SSN: [id number])  Kavontae Pritchard, Darleen Crocker, LCSW

## 2017-06-12 NOTE — Consult Note (Signed)
ORTHOPAEDIC CONSULTATION  REQUESTING PHYSICIAN: Enid Baas, MD  Chief Complaint: left hip pain status post fall  HPI: Glenda Stephens is a 77 y.o. female who complains of left hip pain after an unwitnessed fall at home. She was diagnosed with a displaced left femoral neck hip fracture by x-ray upon arrival at the [facility] emergency Department.  Patient has a history of advanced osteoporosis in the right knee which often gives out on her. It is thought this may be the reason she fell. Patient also has a history of myasthenia gravis. Orthopedics is consulted for management of the left femoral neck hip fracture.  Past Medical History:  Diagnosis Date  . Chronic kidney disease   . Depression   . Diabetes mellitus without complication (HCC)   . Hypertension   . Myasthenia gravis without (acute) exacerbation (HCC)   . Obesity    Past Surgical History:  Procedure Laterality Date  . ABDOMINAL HYSTERECTOMY    . CERVICAL SPINE SURGERY    . CHOLECYSTECTOMY    . LUMBAR SPINE SURGERY     Social History   Socioeconomic History  . Marital status: Widowed    Spouse name: None  . Number of children: None  . Years of education: None  . Highest education level: None  Social Needs  . Financial resource strain: None  . Food insecurity - worry: None  . Food insecurity - inability: None  . Transportation needs - medical: None  . Transportation needs - non-medical: None  Occupational History  . None  Tobacco Use  . Smoking status: Never Smoker  . Smokeless tobacco: Never Used  Substance and Sexual Activity  . Alcohol use: No  . Drug use: No  . Sexual activity: None  Other Topics Concern  . None  Social History Narrative  . None   Family History  Problem Relation Age of Onset  . Diabetes Mellitus II Other    Allergies  Allergen Reactions  . Ativan [Lorazepam] Other (See Comments)    Hallicinations  . Botox [Botulinum Toxin Type A] Other (See Comments)   Reaction: unknown Daughter does not think patient has ever received  . Codeine Hives    Daughter states that the patient does not always react  . Dilaudid [Hydromorphone] Other (See Comments)    Hallicinations  . Magnesium-Containing Compounds Hives  . Penicillamine   . Penicillins Hives    Has patient had a PCN reaction causing immediate rash, facial/tongue/throat swelling, SOB or lightheadedness with hypotension: Yes Has patient had a PCN reaction causing severe rash involving mucus membranes or skin necrosis: No Has patient had a PCN reaction that required hospitalization: No Has patient had a PCN reaction occurring within the last 10 years: Unknown If all of the above answers are "NO", then may proceed with Cephalosporin use.    Prior to Admission medications   Medication Sig Start Date End Date Taking? Authorizing Provider  mycophenolate (CELLCEPT) 250 MG capsule Take 1,000 mg by mouth 2 (two) times daily.    Yes [provider]  sertraline (ZOLOFT) 100 MG tablet Take 100 mg by mouth daily.   Yes [provider]  acetaminophen (TYLENOL) 325 MG tablet Take 650 mg by mouth 3 (three) times daily.    [provider]  benzonatate (TESSALON) 200 MG capsule Take 1 capsule by mouth 3 (three) times daily. 05/30/16   [provider]  busPIRone (BUSPAR) 10 MG tablet Take 20 mg by mouth 2 (two) times daily.  [provider]  calcitonin, salmon, (MIACALCIN/FORTICAL) 200 UNIT/ACT nasal spray Place 1 spray into alternate nostrils daily.    [provider]  diclofenac (VOLTAREN) 50 MG EC tablet Take 50 mg by mouth 2 (two) times daily.    [provider]  folic acid (FOLVITE) 1 MG tablet Take 1 mg by mouth daily.    [provider]  furosemide (LASIX) 20 MG tablet Take 20 mg by mouth.    [provider]  liothyronine (CYTOMEL) 25 MCG tablet Take by mouth daily.    [provider]  memantine (NAMENDA) 10 MG  tablet Take 1 tablet by mouth 2 (two) times daily. 05/30/16   [provider]  Multiple Vitamin (MULTIVITAMIN) tablet Take 1 tablet by mouth daily.    [provider]  omeprazole (PRILOSEC) 20 MG capsule Take 20 mg by mouth daily.    [provider]  oxybutynin (DITROPAN) 5 MG tablet Take 5 mg by mouth 3 (three) times daily.    [provider]  potassium chloride (K-DUR) 10 MEQ tablet Take 10 mEq by mouth daily.    [provider]  pyridostigmine (MESTINON) 60 MG tablet Take 60 mg by mouth 3 (three) times daily.    [provider]  ranitidine (ZANTAC) 150 MG capsule Take 150 mg by mouth 2 (two) times daily.    [provider]  tiZANidine (ZANAFLEX) 2 MG tablet Take by mouth 3 (three) times daily as needed for muscle spasms.    [provider]  traZODone (DESYREL) 50 MG tablet Take 50 mg by mouth at bedtime.    [provider]   Dg Chest 1 View  Result Date: 06/11/2017 CLINICAL DATA:  77 year old female with hip fracture. Preop evaluation. EXAM: CHEST  1 VIEW COMPARISON:  Chest radiograph dated 06/04/2016 FINDINGS: The lungs are clear. There is no pleural effusion or pneumothorax. Mild cardiomegaly. The aorta is tortuous. No acute osseous pathology. IMPRESSION: No active disease. Electronically Signed   By: Elgie Collard M.D.   On: 06/11/2017 23:37   Dg Hip Unilat With Pelvis 2-3 Views Left  Result Date: 06/11/2017 CLINICAL DATA:  77 year old female with fall and left hip pain. EXAM: DG HIP (WITH OR WITHOUT PELVIS) 2-3V LEFT COMPARISON:  None. FINDINGS: There is a transverse fracture of the left femoral neck with mild proximal migration of the femoral shaft in relation to the femoral head. There is no dislocation. The bones are osteopenic. Moderate bilateral hip arthritic changes. The soft tissues are grossly unremarkable. IMPRESSION: Fracture of the left femoral neck.  No dislocation. Electronically Signed   By: Elgie Collard M.D.   On: 06/11/2017 23:35    Positive ROS: All other systems have been reviewed and were otherwise negative with the exception of those mentioned in the HPI and as above.  Physical Exam: General: Alert, no acute distress  MUSCULOSKELETAL: left lower extremity: Patient has shortening and slight external rotation of the left lower extremity. Patient's skin is intact. She has palpable pedal pulses. Patient has intact sensation light touch throughout the left lower extremity and intact motor function distally. She can flex and extend her toes and dorsiflex and plantar flex her ankle.  Assessment: Left femoral neck hip fracture, displaced  Plan: I explained to the patient and her daughter that she has sustained a left femoral neck hip fracture.  The patient's daughter is requesting Dr. Ernest Pine or Dr. Rosita Kea is to do the surgery. Patient has had a history of seeing both  doctors in the past.  I have contacted both doctors. Dr. Rosita KeaMenz this afternoon.  I will sign off.    Juanell FairlyKRASINSKI, Leeam Cedrone, MD    06/12/2017 9:03 AM

## 2017-06-12 NOTE — Anesthesia Procedure Notes (Signed)

## 2017-06-12 NOTE — Transfer of Care (Signed)
Immediate Anesthesia Transfer of Care Note  Patient: Glenda Stephens  Procedure(s) Performed: TOTAL HIP ARTHROPLASTY ANTERIOR APPROACH (Left Hip)  Patient Location: PACU  Anesthesia Type:General  Level of Consciousness: sedated  Airway & Oxygen Therapy: Patient connected to face mask oxygen  Post-op Assessment: Post -op Vital signs reviewed and stable  Post vital signs: stable  Last Vitals:  Vitals:   06/12/17 1057 06/12/17 1802  BP: (!) 136/48   Pulse: 62 70  Resp:  (!) 21  Temp:    SpO2:  100%    Last Pain:  Vitals:   06/12/17 1126  TempSrc:   PainSc: Asleep         Complications: No apparent anesthesia complications

## 2017-06-12 NOTE — Clinical Social Work Placement (Signed)
   CLINICAL SOCIAL WORK PLACEMENT  NOTE  Date:  06/12/2017  Patient Details  Name: Glenda Stephens MRN: [id number] Date of Birth: 08/01/1940  Clinical Social Work is seeking post-discharge placement for this patient at the Skilled  Nursing Facility level of care (*CSW will initial, date and re-position this form in  chart as items are completed):  Yes   Patient/family provided with [place] Clinical Social Work Department's list of facilities offering this level of care within the geographic area requested by the patient (or if unable, by the patient's family).  Yes   Patient/family informed of their freedom to choose among providers that offer the needed level of care, that participate in Medicare, Medicaid or managed care program needed by the patient, have an available bed and are willing to accept the patient.  Yes   Patient/family informed of [facility]'s ownership interest in [facility] and [facility], as well as of the fact that they are under no obligation to receive care at these facilities.  PASRR submitted to EDS on       PASRR number received on       Existing PASRR number confirmed on 06/12/17     FL2 transmitted to all facilities in geographic area requested by pt/family on 06/12/17     FL2 transmitted to all facilities within larger geographic area on       Patient informed that his/her managed care company has contracts with or will negotiate with certain facilities, including the following:            Patient/family informed of bed offers received.  Patient chooses bed at       Physician recommends and patient chooses bed at      Patient to be transferred to   on  .  Patient to be transferred to facility by       Patient family notified on   of transfer.  Name of family member notified:        PHYSICIAN       Additional Comment:    _______________________________________________ Leanndra Pember, Darleen CrockerBailey M, LCSW 06/12/2017, 10:39 AM

## 2017-06-12 NOTE — Progress Notes (Signed)
Patient examined and hip fracture care discussed with patient and her daughter.  She has displaced femoral neck fracture.  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 Risks, benefits discussed. Site marked.

## 2017-06-12 NOTE — Anesthesia Post-op Follow-up Note (Signed)
Anesthesia QCDR form completed.        

## 2017-06-12 NOTE — H&P (Signed)
Glenda Stephens is an 77 y.o. female.   Chief Complaint: Hip pain HPI: The patient with past medical history of myasthenia gravis, hypertension and diabetes presents to the emergency department after a fall.  The patient states that her right knee buckles frequently.  The knee is painful to touch at all times particularly after this fall.  The patient complained of left leg and hip pain which on x-ray was found to be fractured at the femoral neck.  Once the patient's pain was addressed the emergency department staff called the hospitalist service for admission.  Past Medical History:  Diagnosis Date  . Chronic kidney disease   . Depression   . Diabetes mellitus without complication ([place])   . Hypertension   . Myasthenia gravis without (acute) exacerbation ([place])   . Obesity     Past Surgical History:  Procedure Laterality Date  . ABDOMINAL HYSTERECTOMY    . CERVICAL SPINE SURGERY    . CHOLECYSTECTOMY    . LUMBAR SPINE SURGERY      Family History  Problem Relation Age of Onset  . Diabetes Mellitus II Other    Social History:  reports that  has never smoked. she has never used smokeless tobacco. She reports that she does not drink alcohol or use drugs.  Allergies:  Allergies  Allergen Reactions  . Ativan [Lorazepam] Other (See Comments)    Hallicinations  . Botox [Botulinum Toxin Type A] Other (See Comments)    Reaction: unknown Daughter does not think patient has ever received  . Codeine Hives    Daughter states that the patient does not always react  . Dilaudid [Hydromorphone] Other (See Comments)    Hallicinations  . Magnesium-Containing Compounds Hives  . Penicillamine   . Penicillins Hives    Has patient had a PCN reaction causing immediate rash, facial/tongue/throat swelling, SOB or lightheadedness with hypotension: Yes Has patient had a PCN reaction causing severe rash involving mucus membranes or skin necrosis: No Has patient had a PCN reaction that required  hospitalization: No Has patient had a PCN reaction occurring within the last 10 years: Unknown If all of the above answers are "NO", then may proceed with Cephalosporin use.     Medications Prior to Admission  Medication Sig Dispense Refill  . mycophenolate (CELLCEPT) 250 MG capsule Take 1,000 mg by mouth 2 (two) times daily.     . sertraline (ZOLOFT) 100 MG tablet Take 100 mg by mouth daily.    Marland Kitchen acetaminophen (TYLENOL) 325 MG tablet Take 650 mg by mouth 3 (three) times daily.    . benzonatate (TESSALON) 200 MG capsule Take 1 capsule by mouth 3 (three) times daily.  0  . busPIRone (BUSPAR) 10 MG tablet Take 20 mg by mouth 2 (two) times daily.    . calcitonin, salmon, (MIACALCIN/FORTICAL) 200 UNIT/ACT nasal spray Place 1 spray into alternate nostrils daily.    . diclofenac (VOLTAREN) 50 MG EC tablet Take 50 mg by mouth 2 (two) times daily.    . folic acid (FOLVITE) 1 MG tablet Take 1 mg by mouth daily.    . furosemide (LASIX) 20 MG tablet Take 20 mg by mouth.    . liothyronine (CYTOMEL) 25 MCG tablet Take by mouth daily.    . memantine (NAMENDA) 10 MG tablet Take 1 tablet by mouth 2 (two) times daily.  11  . Multiple Vitamin (MULTIVITAMIN) tablet Take 1 tablet by mouth daily.    Marland Kitchen omeprazole (PRILOSEC) 20 MG capsule Take 20 mg by  mouth daily.    Marland Kitchen oxybutynin (DITROPAN) 5 MG tablet Take 5 mg by mouth 3 (three) times daily.    . potassium chloride (K-DUR) 10 MEQ tablet Take 10 mEq by mouth daily.    Marland Kitchen pyridostigmine (MESTINON) 60 MG tablet Take 60 mg by mouth 3 (three) times daily.    . ranitidine (ZANTAC) 150 MG capsule Take 150 mg by mouth 2 (two) times daily.    Marland Kitchen tiZANidine (ZANAFLEX) 2 MG tablet Take by mouth 3 (three) times daily as needed for muscle spasms.    . traZODone (DESYREL) 50 MG tablet Take 50 mg by mouth at bedtime.      Results for orders placed or performed during the hospital encounter of 06/11/17 (from the past 48 hour(s))  Comprehensive metabolic panel     Status:  Abnormal   Collection Time: 06/12/17 12:17 AM  Result Value Ref Range   Sodium 136 135 - 145 mmol/L   Potassium 3.8 3.5 - 5.1 mmol/L   Chloride 104 101 - 111 mmol/L   CO2 24 22 - 32 mmol/L   Glucose, Bld 118 (H) 65 - 99 mg/dL   BUN 20 6 - 20 mg/dL   Creatinine, Ser 1.23 (H) 0.44 - 1.00 mg/dL   Calcium 9.3 8.9 - 10.3 mg/dL   Total Protein 7.0 6.5 - 8.1 g/dL   Albumin 3.9 3.5 - 5.0 g/dL   AST 24 15 - 41 U/L   ALT 16 14 - 54 U/L   Alkaline Phosphatase 83 38 - 126 U/L   Total Bilirubin 0.7 0.3 - 1.2 mg/dL   GFR calc non Af Amer 41 (L) >60 mL/min   GFR calc Af Amer 47 (L) >60 mL/min    Comment: (NOTE) The eGFR has been calculated using the CKD EPI equation. This calculation has not been validated in all clinical situations. eGFR's persistently <60 mL/min signify possible Chronic Kidney Disease.    Anion gap 8 5 - 15    Comment: Performed at [facility], [address] [place], [place] [address]  CBC     Status: None   Collection Time: 06/12/17 12:17 AM  Result Value Ref Range   WBC 5.6 3.6 - 11.0 K/uL   RBC 4.33 3.80 - 5.20 MIL/uL   Hemoglobin 13.7 12.0 - 16.0 g/dL   HCT 41.9 35.0 - 47.0 %   MCV 96.9 80.0 - 100.0 fL   MCH 31.6 26.0 - 34.0 pg   MCHC 32.6 32.0 - 36.0 g/dL   RDW 13.7 11.5 - 14.5 %   Platelets 160 150 - 440 K/uL    Comment: Performed at [facility], [address] [place], [place] [address]  Troponin I     Status: None   Collection Time: 06/12/17 12:17 AM  Result Value Ref Range   Troponin I <0.03 <0.03 ng/mL    Comment: Performed at [facility], [address] [place], [place] [address]  Type and screen [place]     Status: None   Collection Time: 06/12/17 12:17 AM  Result Value Ref Range   ABO/RH(D) B POS    Antibody Screen NEG    Sample Expiration      06/15/2017 Performed at [facility], [address] [place], [place] [address]   TSH     Status: None   Collection Time: 06/12/17   7:07 AM  Result Value Ref Range   TSH 3.514 0.350 - 4.500 uIU/mL    Comment:  Performed by a 3rd Generation assay with a functional sensitivity of <=0.01 uIU/mL. Performed at [facility], [address], [place], [place] [address]    Dg Chest 1 View  Result Date: 06/11/2017 CLINICAL DATA:  77 year old female with hip fracture. Preop evaluation. EXAM: CHEST  1 VIEW COMPARISON:  Chest radiograph dated 06/04/2016 FINDINGS: The lungs are clear. There is no pleural effusion or pneumothorax. Mild cardiomegaly. The aorta is tortuous. No acute osseous pathology. IMPRESSION: No active disease. Electronically Signed   By: Anner Crete M.D.   On: 06/11/2017 23:37   Dg Hip Unilat With Pelvis 2-3 Views Left  Result Date: 06/11/2017 CLINICAL DATA:  77 year old female with fall and left hip pain. EXAM: DG HIP (WITH OR WITHOUT PELVIS) 2-3V LEFT COMPARISON:  None. FINDINGS: There is a transverse fracture of the left femoral neck with mild proximal migration of the femoral shaft in relation to the femoral head. There is no dislocation. The bones are osteopenic. Moderate bilateral hip arthritic changes. The soft tissues are grossly unremarkable. IMPRESSION: Fracture of the left femoral neck.  No dislocation. Electronically Signed   By: Anner Crete M.D.   On: 06/11/2017 23:35    Review of Systems  Constitutional: Negative for chills and fever.  HENT: Negative for sore throat and tinnitus.   Eyes: Negative for blurred vision and redness.  Respiratory: Negative for cough and shortness of breath.   Cardiovascular: Negative for chest pain, palpitations, orthopnea and PND.  Gastrointestinal: Negative for abdominal pain, diarrhea, nausea and vomiting.  Genitourinary: Negative for dysuria, frequency and urgency.  Musculoskeletal: Positive for falls and joint pain. Negative for myalgias.  Skin: Negative for rash.       No lesions  Neurological: Negative for speech change, focal weakness and  weakness.  Endo/Heme/Allergies: Does not bruise/bleed easily.       No temperature intolerance  Psychiatric/Behavioral: Negative for depression and suicidal ideas.    Blood pressure (!) 130/49, pulse 67, temperature 98.6 F (37 C), temperature source Oral, resp. rate 18, height _0  (1.778 m), weight 103 kg (227 lb), SpO2 95 %. Physical Exam  Vitals reviewed. Constitutional: She is oriented to person, place, and time. She appears well-developed and well-nourished. No distress.  HENT:  Head: Normocephalic and atraumatic.  Mouth/Throat: Oropharynx is clear and moist.  Eyes: Conjunctivae and EOM are normal. Pupils are equal, round, and reactive to light.  Neck: Normal range of motion. Neck supple. No JVD present. No tracheal deviation present. No thyromegaly present.  Cardiovascular: Normal rate, regular rhythm and normal heart sounds. Exam reveals no gallop and no friction rub.  No murmur heard. Respiratory: Effort normal and breath sounds normal.  GI: Soft. Bowel sounds are normal. She exhibits no distension. There is no tenderness.  Genitourinary:  Genitourinary Comments: Deferred  Musculoskeletal: Normal range of motion. She exhibits no edema.  Lymphadenopathy:    She has no cervical adenopathy.  Neurological: She is alert and oriented to person, place, and time. No cranial nerve deficit. She exhibits normal muscle tone.  Skin: Skin is warm and dry. No rash noted. No erythema.  Psychiatric: She has a normal mood and affect. Her behavior is normal. Judgment and thought content normal.     Assessment/Plan This is a 77 year old female admitted for hip fracture. 1.  Hip fracture: Manage pain; orthopedic surgery to consult.  Due to neuromuscular disease (myasthenia gravis) and advanced age the patient is high risk for surgery.   2.  Acute kidney injury: Hydrate with  intravenous fluid.  Avoid nephrotoxic agents 3.  Hypertension: Controlled; continue to monitor 4.  Diabetes mellitus  type 2: Hold oral hypoglycemic agents.  Sliding scale insulin while hospitalized.  Check hemoglobin A1c 5.  Hypothyroidism: Check TSH; continue liothyronine 6.  Myasthenia gravis: Controlled; continue for cystic main 7.  Depression: Continue BuSpar, Zoloft and trazodone  8.  Dementia: Alzheimer's type; continue Namenda 9.  DVT prophylaxis: SCDs 10.  GI prophylaxis: Pantoprazole per home regimen The patient is a full code.  Time spent on admission orders and patient care approximately 45 minutes   Harrie Foreman, MD 06/12/2017, 8:23 AM

## 2017-06-12 NOTE — Op Note (Signed)
06/12/2017  6:00 PM  PATIENT:  Glenda Stephens  77 y.o. female  PRE-OPERATIVE DIAGNOSIS:  FEMORAL NECK FRACTURE and osteoarthritis left hip  POST-OPERATIVE DIAGNOSIS:  FEMORAL NECK FRACTURE and osteoarthritis left hip  PROCEDURE:  Procedure(s): TOTAL HIP ARTHROPLASTY ANTERIOR APPROACH (Left)  SURGEON: Leitha SchullerMichael J Briseida Gittings, MD  ASSISTANTS: None  ANESTHESIA:   general  EBL:  Total I/O In: -  Out: 730 [Urine:30; Blood:700]  BLOOD ADMINISTERED:none  DRAINS: (2) Hemovact drain(s) in the Subcutaneous layer with  Suction Open   LOCAL MEDICATIONS USED:  MARCAINE     SPECIMEN:  Source of Specimen:  Left femoral head  DISPOSITION OF SPECIMEN:  PATHOLOGY  COUNTS:  YES  TOURNIQUET:  * No tourniquets in log *  IMPLANTS: Medacta amis standard 8 stem with 54 Mpact DM cup with liner and M 28 mm metal head  DICTATION: .Dragon Dictation   The patient was brought to the operating room and after spinal anesthesia was obtained patient was placed on the operative table with the ipsilateral foot into the Medacta attachment, contralateral leg on a well-padded table. C-arm was brought in and preop template x-ray taken. After prepping and draping in usual sterile fashion appropriate patient identification and timeout procedures were completed. Anterior approach to the hip was obtained and centered over the greater trochanter and TFL muscle. The subcutaneous tissue was incised hemostasis being achieved by electrocautery. TFL fascia was incised and the muscle retracted laterally deep retractor placed. The lateral femoral circumflex vessels were identified and ligated. The anterior capsule was exposed and a capsulotomy performed. The neck was identified and a femoral neck cut carried out with a saw. The head was removed without difficulty and showed sclerotic femoral head and acetabulum. Reaming was carried out to 54 mm and a 54 mm cup trial gave appropriate tightness to the acetabular component a 54 DM cup was  impacted into position. The leg was then externally rotated and ischiofemoral and pubofemoral releases carried out. The femur was sequentially broached to a size 8, size 8 standard with S head and millimeters head trials were placed and the final components chosen. The 8 standard stem was inserted along with a M 28 mm head and 54 mm liner. The hip was reduced and was stable the wound was thoroughly irrigated with fibrillar placed along the posterior capsule and medial neck. The deep fascia v was closed using a heavy Quill after infiltration of 30 cc of quarter percent Sensorcaine with epinephrine. Subcutaneous drains were then inserted, 3-0 v-loc subcutaneously followed by skin staples and incisional wound VAC  PLAN OF CARE: Continue as inpatient

## 2017-06-12 NOTE — ED Notes (Signed)
Admitting MD Sheryle Hailiamond at bedside.

## 2017-06-13 ENCOUNTER — Encounter: Payer: Self-pay | Admitting: Orthopedic Surgery

## 2017-06-13 LAB — BASIC METABOLIC PANEL
Anion gap: 7 (ref 5–15)
BUN: 17 mg/dL (ref 6–20)
CHLORIDE: 110 mmol/L (ref 101–111)
CO2: 20 mmol/L — AB (ref 22–32)
CREATININE: 0.84 mg/dL (ref 0.44–1.00)
Calcium: 8.3 mg/dL — ABNORMAL LOW (ref 8.9–10.3)
GFR calc Af Amer: >60 mL/min (ref >60)
GFR calc non Af Amer: >60 mL/min (ref >60)
Glucose, Bld: 160 mg/dL — ABNORMAL HIGH (ref 65–99)
Potassium: 4.3 mmol/L (ref 3.5–5.1)
SODIUM: 137 mmol/L (ref 135–145)

## 2017-06-13 LAB — CBC
HCT: 33 % — ABNORMAL LOW (ref 35.0–47.0)
HEMOGLOBIN: 11 g/dL — AB (ref 12.0–16.0)
MCH: 32.5 pg (ref 26.0–34.0)
MCHC: 33.3 g/dL (ref 32.0–36.0)
MCV: 97.7 fL (ref 80.0–100.0)
Platelets: 110 10*3/uL — ABNORMAL LOW (ref 150–440)
RBC: 3.38 MIL/uL — ABNORMAL LOW (ref 3.80–5.20)
RDW: 13.2 % (ref 11.5–14.5)
WBC: 7 10*3/uL (ref 3.6–11.0)

## 2017-06-13 MED ORDER — FE FUMARATE-B12-VIT C-FA-IFC PO CAPS
1.0000 | ORAL_CAPSULE | Freq: Two times a day (BID) | ORAL | Status: DC
  Administered 2017-06-13 – 2017-06-14 (×2): 1 via ORAL
  Filled 2017-06-13 (×4): qty 1

## 2017-06-13 MED ORDER — HALOPERIDOL LACTATE 5 MG/ML IJ SOLN
5.0000 mg | INTRAMUSCULAR | Status: DC | PRN
  Administered 2017-06-13 – 2017-06-14 (×2): 5 mg via INTRAMUSCULAR
  Filled 2017-06-13 (×2): qty 1

## 2017-06-13 NOTE — Progress Notes (Signed)
Clinical Child psychotherapist (CSW) sent PT evaluation to [employer]	[place] via [place]. [place] will send PT note to [employer].   [employer], LCSW [phone]

## 2017-06-13 NOTE — Progress Notes (Signed)
Physical therapy went into room and found the Hemovac drain on the floor at bedside.  Applied dressing. Notified MD. No new orders at this time.
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
## 2017-06-13 NOTE — Progress Notes (Signed)
[facility] - [place] at [facility]   PATIENT NAME: Oneida ArenasLinda Coyle    MR#:  [id number]  DATE OF BIRTH:  12-14-1940  SUBJECTIVE:  CHIEF COMPLAINT:   Chief Complaint  Patient presents with  . Hip Pain    left   -patient is very confused, alert. Postop day 1 after left hip arthroplasty. -Has a Hemovac in place  REVIEW OF SYSTEMS:  Review of Systems  Unable to perform ROS: Dementia    DRUG ALLERGIES:   Allergies  Allergen Reactions  . Ativan [Lorazepam] Other (See Comments)    Hallicinations  . Botox [Botulinum Toxin Type A] Other (See Comments)    Reaction: unknown Daughter does not think patient has ever received  . Codeine Hives    Daughter states that the patient does not always react  . Dilaudid [Hydromorphone] Other (See Comments)    Hallicinations  . Magnesium-Containing Compounds Hives  . Penicillamine   . Penicillins Hives    Has patient had a PCN reaction causing immediate rash, facial/tongue/throat swelling, SOB or lightheadedness with hypotension: Yes Has patient had a PCN reaction causing severe rash involving mucus membranes or skin necrosis: No Has patient had a PCN reaction that required hospitalization: No Has patient had a PCN reaction occurring within the last 10 years: Unknown If all of the above answers are "NO", then may proceed with Cephalosporin use.     VITALS:  Blood pressure (!) 127/46, pulse 63, temperature 98.2 F (36.8 C), temperature source Oral, resp. rate 19, height 5\' 10"  (1.778 m), weight 103 kg (227 lb), SpO2 96 %.  PHYSICAL EXAMINATION:  Physical Exam   GENERAL:  77 y.o.-year-old elderly patient lying in the bed with no acute distress.  EYES: Pupils equal, round, reactive to light and accommodation. No scleral icterus. Extraocular muscles intact.  HEENT: Head atraumatic, normocephalic. Oropharynx and nasopharynx clear.  NECK:  Supple, no jugular venous distention. No thyroid enlargement, no tenderness.    LUNGS: Normal breath sounds bilaterally, no wheezing, rales,rhonchi or crepitation. No use of accessory muscles of respiration. Decreased bibasilar breath sounds CARDIOVASCULAR: S1, S2 normal. No rubs, or gallops. 3/6 systolic murmur present ABDOMEN: Soft, nontender, nondistended. Bowel sounds present. No organomegaly or mass.  EXTREMITIES: status post left hip arthroplasty, has a Hemovac in place.No pedal edema, cyanosis, or clubbing.  NEUROLOGIC: Cranial nerves II through XII are intact. No focal motor or sensory deficits.. Gait not checked.  PSYCHIATRIC: The patient is alert and oriented to self.  SKIN: No obvious rash, lesion, or ulcer.    LABORATORY PANEL:   CBC Recent Labs  Lab 06/13/17 0418  WBC 7.0  HGB 11.0*  HCT 33.0*  PLT 110*   ------------------------------------------------------------------------------------------------------------------  Chemistries  Recent Labs  Lab 06/12/17 0017 06/13/17 0418  NA 136 137  K 3.8 4.3  CL 104 110  CO2 24 20*  GLUCOSE 118* 160*  BUN 20 17  CREATININE 1.23* 0.84  CALCIUM 9.3 8.3*  AST 24  --   ALT 16  --   ALKPHOS 83  --   BILITOT 0.7  --    ------------------------------------------------------------------------------------------------------------------  Cardiac Enzymes Recent Labs  Lab 06/12/17 0017  TROPONINI <0.03   ------------------------------------------------------------------------------------------------------------------  RADIOLOGY:  Dg Chest 1 View  Result Date: 06/11/2017 CLINICAL DATA:  77 year old female with hip fracture. Preop evaluation. EXAM: CHEST  1 VIEW COMPARISON:  Chest radiograph dated 06/04/2016 FINDINGS: The lungs are clear. There is no pleural effusion or pneumothorax. Mild cardiomegaly. The aorta is tortuous. No  acute osseous pathology. IMPRESSION: No active disease. Electronically Signed   By: Elgie Collard M.D.   On: 06/11/2017 23:37   Dg Hip Operative Unilat W Or W/o Pelvis  Left  Result Date: 06/12/2017 CLINICAL DATA:  Hip replacement EXAM: OPERATIVE LEFT HIP (WITH PELVIS IF PERFORMED) 6 VIEWS TECHNIQUE: Fluoroscopic spot image(s) were submitted for interpretation post-operatively. COMPARISON:  06/11/2017 FINDINGS: Multiple intraoperative spot images demonstrate changes of left hip replacement. No visible hardware complicating feature. Normal AP alignment. IMPRESSION: Left hip replacement.  No visible complicating feature. Electronically Signed   By: Charlett Nose M.D.   On: 06/12/2017 18:01   Dg Hip Unilat W Or W/o Pelvis 2-3 Views Left  Result Date: 06/12/2017 CLINICAL DATA:  Postop. EXAM: DG HIP (WITH OR WITHOUT PELVIS) 2-3V LEFT COMPARISON:  None. FINDINGS: The patient has undergone LEFT total hip arthroplasty for LEFT femoral neck fracture. Satisfactory position and alignment. Osteopenia. Surgical drain. IMPRESSION: Satisfactory postoperative appearance. Electronically Signed   By: Elsie Stain M.D.   On: 06/12/2017 18:49   Dg Hip Unilat With Pelvis 2-3 Views Left  Result Date: 06/11/2017 CLINICAL DATA:  77 year old female with fall and left hip pain. EXAM: DG HIP (WITH OR WITHOUT PELVIS) 2-3V LEFT COMPARISON:  None. FINDINGS: There is a transverse fracture of the left femoral neck with mild proximal migration of the femoral shaft in relation to the femoral head. There is no dislocation. The bones are osteopenic. Moderate bilateral hip arthritic changes. The soft tissues are grossly unremarkable. IMPRESSION: Fracture of the left femoral neck.  No dislocation. Electronically Signed   By: Elgie Collard M.D.   On: 06/11/2017 23:35    EKG:   Orders placed or performed during the hospital encounter of 06/04/16  . EKG 12-Lead  . EKG 12-Lead  . EKG    ASSESSMENT AND PLAN:   77 year old female with past medical history significant for hypertension, diabetes, COPD, myasthenia gravis who is living at an apartment with family, brought in after a fall and left  femoral neck fracture  1. Left femoral neck fracture-postop day 1 after total hip arthroplasty of the left -Appreciate orthopedics consult. -Pain control, physical therapy consult today -Likely will need rehabilitation at discharge.  2. Acute delirium on dementia-patient is very confused this morning. Likely acute delirium. -Monitor closely. PRN medications for any agitation  3.acute blood loss postoperative anemia-no indication for transfusion. Monitor again in a.m. -iron supplements started -Hemovac in place. Received a dose of tranexamic acid  4. Dementia and depression-on Namenda, BuSpar  5. Hypothyroidism-continue Synthroid  6. DVT prophylaxis-started on Lovenox.  Physical therapy consult requested   All the records are reviewed and case discussed with Care Management/Social Workerr. Management plans discussed with the patient, family and they are in agreement.  CODE STATUS: DNR  TOTAL TIME TAKING CARE OF THIS PATIENT: 38 minutes.   POSSIBLE D/C IN 2 DAYS, DEPENDING ON CLINICAL CONDITION.   Enid Baas M.D on 06/13/2017 at 8:56 AM  Between 7am to 6pm - Pager - [phone]  After 6pm go to [email or web address] - Social research officer, government  [facility]  Office  [phone]  CC: Primary care physician; Dione Housekeeper, MD

## 2017-06-13 NOTE — Progress Notes (Signed)
   Subjective: 1 Day Post-Op Procedure(s) (LRB): TOTAL HIP ARTHROPLASTY ANTERIOR APPROACH (Left) Patient reports pain as mild.   Patient is well, and has had no acute complaints or problems. Requesting to see her daughter. Shows signs of dementia. Denies any CP, SOB, ABD pain. We will start therapy today.  Plan is to go Skilled nursing facility after hospital stay.  Objective: Vital signs in last 24 hours: Temp:  [97.6 F (36.4 C)-98.6 F (37 C)] 98.2 F (36.8 C) (03/14 0331) Pulse Rate:  [62-81] 63 (03/14 0331) Resp:  [17-21] 19 (03/14 0331) BP: (112-153)/(38-94) 127/46 (03/14 0331) SpO2:  [92 %-100 %] 96 % (03/14 0331) Weight:  [227 lb (103 kg)] 227 lb (103 kg) (03/14 0500)  Intake/Output from previous day: 03/13 0701 - 03/14 0700 In: 550 [I.V.:150] Out: 730 [Urine:30; Blood:700] Intake/Output this shift: No intake/output data recorded.  Recent Labs    06/12/17 0017 06/13/17 0418  HGB 13.7 11.0*   Recent Labs    06/12/17 0017 06/13/17 0418  WBC 5.6 7.0  RBC 4.33 3.38*  HCT 41.9 33.0*  PLT 160 110*   Recent Labs    06/12/17 0017 06/13/17 0418  NA 136 137  K 3.8 4.3  CL 104 110  CO2 24 20*  BUN 20 17  CREATININE 1.23* 0.84  GLUCOSE 118* 160*  CALCIUM 9.3 8.3*   No results for input(s): LABPT, INR in the last 72 hours.  EXAM General - Patient is Alert, Appropriate and Confused. Appears comfortable, no distress Extremity - Neurovascular intact Sensation intact distally Intact pulses distally Dorsiflexion/Plantar flexion intact No cellulitis present Compartment soft Dressing - dressing C/D/I and no drainage. hemovac and wound vac intact Motor Function - intact, moving foot and toes well on exam.   Past Medical History:  Diagnosis Date  . Chronic kidney disease   . Depression   . Diabetes mellitus without complication (HCC)   . Hypertension   . Myasthenia gravis without (acute) exacerbation (HCC)   . Obesity     Assessment/Plan:   1 Day  Post-Op Procedure(s) (LRB): TOTAL HIP ARTHROPLASTY ANTERIOR APPROACH (Left) Active Problems:   Hip fracture (HCC)  Estimated body mass index is 32.57 kg/m as calculated from the following:   Height as of this encounter: 5\' 10"  (1.778 m).   Weight as of this encounter: 227 lb (103 kg). Advance diet Up with therapy  Needs BM Acute post op blood loss anemia - Hgb 11.0. Start Iron supplement Recheck labs in the am CM to assist with discharge to SNF Remove hemovac tomorrow  DVT Prophylaxis - Lovenox, Foot Pumps and TED hose Weight-Bearing as tolerated to left leg   T. Cranston Neighborhris Staria Birkhead, PA-C [facility] 06/13/2017, 8:34 AM

## 2017-06-13 NOTE — Evaluation (Signed)
Physical Therapy Evaluation Patient Details Name: Glenda Stephens MRN: [id number] DOB: 12-Oct-1940 Today's Date: 06/13/2017   History of Present Illness  Pt is a 77 y.o. F who presented to hospital 06/11/17 with L hip pain and report of fall. Pt admitted 06/11/17 w/ diagnosis of L femoral neck fracture. Surgery on 06/12/17 L THA anterior approach. PT evaled Pt POD #1, 06/13/17. PMHx includes: alzheimer's dementia, fall, chronic kidney disease, DM, HTN, myasthenia gravis, obesity, chronic bradycardia, OA, arthritis, hypothyroid, cervical and lumbar surgeries.     Clinical Impression  Pt transferred supine to sit w/ min assist and attempted sit to stand x 3 with mod assist x 2, from EOB.  Pt demonstrated fatigue after sit to stand attempts and was returned to supine from sitting position, requiring mod assist x 2 (see mobility for details). Pt has history of baseline cognitive impairments, Pt demonstrated motivation for participation in session; however she demonstrated mild confusion and required repeated vc's for technique with transfers and to be reminded to not pull on lines or leads. PT will focus on progression of strengthening and transfers at next session. Recommend transition to SNF upon discharge from acute hospitalization.    Follow Up Recommendations SNF    Equipment Recommendations  Rolling walker with 5" wheels;3in1 (PT)    Recommendations for Other Services       Precautions / Restrictions Precautions Precautions: Anterior Hip;Fall Precaution Booklet Issued: Yes (comment) Restrictions Weight Bearing Restrictions: Yes Other Position/Activity Restrictions: LLE WBAT      Mobility  Bed Mobility Overal bed mobility: Needs Assistance Bed Mobility: Supine to Sit;Sit to Supine     Supine to sit: Min assist Sit to supine: +2 for physical assistance;Mod assist   General bed mobility comments: Pt required min assist with supine to sit to lower legs from EOB to floor (Pt able to  move legs across bed), and to position hips square to EOB. Pt was fatigued from attempting to stand x3, therefore required increased level of assist with sit to supine. Pt required mod assist x 2 to scoot hips back with pad, lower trunk back to bed, and raise BLE from floor to bed.  Transfers Overall transfer level: Needs assistance Equipment used: Rolling walker (2 wheeled) Transfers: Sit to/from Stand Sit to Stand: +2 physical assistance         General transfer comment: Pt attempted sit to stand x 3 with mod assist x 2, vc's for hand placement, pushing through BLE, and to bring chest up and hips forward; Pt remained in forward flexed position and unable to stand fully upright; thus Pt required to sit.  After 3 attempts, Pt demonstrated fatigue and Pt was returned to supine with mod assist x 2 (see sit to supine for details).  Ambulation/Gait                Stairs            Wheelchair Mobility    Modified Rankin (Stroke Patients Only)       Balance Overall balance assessment: Needs assistance Sitting-balance support: No upper extremity supported;Feet supported Sitting balance-Leahy Scale: Fair Sitting balance - Comments: Pt demonstrated fair sitting balance at EOB while managing clothing within BOS.   Standing balance support: Bilateral upper extremity supported Standing balance-Leahy Scale: Poor Standing balance comment: Pt demonstrated poor standing balance at EOB w/ RW + 2 assist; Pt unable to reach fully upright posture w/ cueing.  Pertinent Vitals/Pain Pain Assessment: 0-10 Pain Score: 6  Pain Location: L hip(Pt also reports R knee pain 5/10.) Pain Descriptors / Indicators: Throbbing Pain Intervention(s): Limited activity within patient's tolerance;Monitored during session;Repositioned(RN notified.)    Home Living Family/patient expects to be discharged to:: Private residence Living Arrangements:  Children Available Help at Discharge: Family;Available PRN/intermittently Type of Home: House Home Access: Ramped entrance Entrance Stairs-Rails: Can reach both   Home Layout: One level Home Equipment: Walker - 4 wheels Additional Comments: All information provided by Pt; she has a history of baseline cognitive impairment and family was not present to verify information.    Prior Function Level of Independence: Needs assistance   Gait / Transfers Assistance Needed: Family not present to provide information.  ADL's / Homemaking Assistance Needed: Family not present to provide information.  Comments: Family not present to provide information.     Hand Dominance   Dominant Hand: Right(Pt reports that she uses both, but writes w/ her R hand.)    Extremity/Trunk Assessment   Upper Extremity Assessment Upper Extremity Assessment: RUE deficits/detail;LUE deficits/detail RUE Deficits / Details: Pt demonstrated BUE AROM WNL and demonstrated BUE strength at least 3/5 when touching head. LUE Deficits / Details: Pt demonstrated BUE AROM WNL and demonstrated BUE strength at least 3/5 when touching head.    Lower Extremity Assessment Lower Extremity Assessment: RLE deficits/detail;LLE deficits/detail RLE Deficits / Details: Pt demonstrated RLE PROM WNL and RLE strength at least 2/5 when moving leg across the bed. LLE Deficits / Details: Pt demonstrated LLE strength at least 2/5 when moving leg across the bed. ROM assessment was limited due to pain. LLE: Unable to fully assess due to pain    Cervical / Trunk Assessment Cervical / Trunk Assessment: Kyphotic  Communication   Communication: Expressive difficulties;Receptive difficulties  Cognition Arousal/Alertness: Awake/alert Behavior During Therapy: WFL for tasks assessed/performed Overall Cognitive Status: History of cognitive impairments - at baseline                                 General Comments: Pt A&O x 3; Pt  pleasant and agreeable to session.      General Comments General comments (skin integrity, edema, etc.): Hemovac line became loose on pouch, RN notified. Scratch on L arm noted, at therapist arrival to room, but not actively bleeding.  Pt agreeable to session.    Exercises Total Joint Exercises Ankle Circles/Pumps: AROM;Both;Strengthening;Supine;10 reps Short Arc Quad: AROM;Strengthening;Left;5 reps;Supine Heel Slides: AAROM;Supine;Strengthening;Left;5 reps Hip ABduction/ADduction: AROM;Strengthening;Left;5 reps;Supine Straight Leg Raises: (Attempted, Pt unable to lift leg.)   Assessment/Plan    PT Assessment Patient needs continued PT services  PT Problem List Decreased strength;Decreased range of motion;Decreased activity tolerance;Decreased balance;Decreased mobility;Decreased coordination;Decreased knowledge of use of DME;Decreased safety awareness;Decreased knowledge of precautions;Pain       PT Treatment Interventions DME instruction;Gait training;Functional mobility training;Therapeutic activities;Therapeutic exercise;Balance training;Patient/family education    PT Goals (Current goals can be found in the Care Plan section)  Acute Rehab PT Goals Patient Stated Goal: I want to be able to walk. PT Goal Formulation: With patient Time For Goal Achievement: 06/27/17 Potential to Achieve Goals: Fair    Frequency BID   Barriers to discharge        Co-evaluation               AM-PAC PT "6 Clicks" Daily Activity  Outcome Measure Difficulty turning over in bed (including adjusting bedclothes, sheets and blankets)?:  A Lot Difficulty moving from lying on back to sitting on the side of the bed? : Unable Difficulty sitting down on and standing up from a chair with arms (e.g., wheelchair, bedside commode, etc,.)?: Unable Help needed moving to and from a bed to chair (including a wheelchair)?: Total Help needed walking in hospital room?: Total Help needed climbing 3-5 steps  with a railing? : Total 6 Click Score: 7    End of Session Equipment Utilized During Treatment: Gait belt Activity Tolerance: Patient limited by fatigue Patient left: in bed;with call bell/phone within reach;with bed alarm set;with family/visitor present;with SCD's reapplied(B heels elevated by pillow. Bed left in lowest position.) Nurse Communication: Mobility status;Other (comment)(RN notified hemovac line became loose from bag. NT notified pure wick was removed.) PT Visit Diagnosis: Unsteadiness on feet (R26.81);Other abnormalities of gait and mobility (R26.89);Muscle weakness (generalized) (M62.81);History of falling (Z91.81);Difficulty in walking, not elsewhere classified (R26.2);Pain Pain - Right/Left: Left Pain - part of body: Hip    Time: [id number] PT Time Calculation (min) (ACUTE ONLY): 72 min   Charges:         PT G Codes:        Arryn Terrones Mondrian-Pardue, SPT 06/13/2017, 12:37 PM

## 2017-06-13 NOTE — Anesthesia Postprocedure Evaluation (Signed)
Anesthesia Post Note  Patient: Glenda Stephens  Procedure(s) Performed: TOTAL HIP ARTHROPLASTY ANTERIOR APPROACH (Left Hip)  Patient location during evaluation: PACU Anesthesia Type: General Level of consciousness: awake and alert and oriented Pain management: pain level controlled Vital Signs Assessment: post-procedure vital signs reviewed and stable Respiratory status: spontaneous breathing Cardiovascular status: blood pressure returned to baseline Anesthetic complications: no     Last Vitals:  Vitals:   06/12/17 2254 06/13/17 0331  BP: (!) 116/43 (!) 127/46  Pulse: 65 63  Resp: 18 19  Temp: 37 C 36.8 C  SpO2: 95% 96%    Last Pain:  Vitals:   06/13/17 0331  TempSrc: Oral  PainSc:                  Jefferey Lippmann

## 2017-06-13 NOTE — Progress Notes (Signed)
Clinical Social Worker (CSW) met with patient's daughter Izora Gala at her request to discuss long term care options after rehab. Per Izora Gala she is interested in patient going [facility] after rehab. CSW explained to daughter how to apply for long term care medicaid at [place] [facility] and told her to contact Counselling psychologist at [facility].   [employer], LCSW [phone]

## 2017-06-13 NOTE — Progress Notes (Addendum)
Pt refused all her night time medicine. She is  Screaming, and pulled out her Iv. Pt stated " some is standing at the door with a gun." Attempted to reinsert but pt became combative. Dr Katheren ShamsSalary made aware. Haldol order obtained.

## 2017-06-13 NOTE — Care Management Important Message (Signed)
Important Message  Patient Details  Name: Glenda Stephens MRN: [id number] Date of Birth: 1941/01/24   Medicare Important Message Given:  Yes    Olegario MessierKathy A Mattalynn Crandle 06/13/2017, 9:57 AM

## 2017-06-14 LAB — GLUCOSE, CAPILLARY: Glucose-Capillary: 130 mg/dL — ABNORMAL HIGH (ref 65–99)

## 2017-06-14 LAB — BASIC METABOLIC PANEL
Anion gap: 10 (ref 5–15)
BUN: 14 mg/dL (ref 6–20)
CALCIUM: 8.7 mg/dL — AB (ref 8.9–10.3)
CO2: 21 mmol/L — AB (ref 22–32)
Chloride: 106 mmol/L (ref 101–111)
Creatinine, Ser: 0.68 mg/dL (ref 0.44–1.00)
GFR calc Af Amer: >60 mL/min (ref >60)
GLUCOSE: 131 mg/dL — AB (ref 65–99)
Potassium: 3.6 mmol/L (ref 3.5–5.1)
Sodium: 137 mmol/L (ref 135–145)

## 2017-06-14 LAB — SURGICAL PATHOLOGY

## 2017-06-14 LAB — CBC
HEMATOCRIT: 33.6 % — AB (ref 35.0–47.0)
Hemoglobin: 10.9 g/dL — ABNORMAL LOW (ref 12.0–16.0)
MCH: 31.3 pg (ref 26.0–34.0)
MCHC: 32.4 g/dL (ref 32.0–36.0)
MCV: 96.5 fL (ref 80.0–100.0)
PLATELETS: 136 10*3/uL — AB (ref 150–440)
RBC: 3.48 MIL/uL — ABNORMAL LOW (ref 3.80–5.20)
RDW: 13.4 % (ref 11.5–14.5)
WBC: 9.7 10*3/uL (ref 3.6–11.0)

## 2017-06-14 MED ORDER — PYRIDOSTIGMINE BROMIDE 60 MG PO TABS: 60.0000 mg | ORAL_TABLET | Freq: Three times a day (TID) | ORAL | 0 refills | Status: AC

## 2017-06-14 MED ORDER — FE FUMARATE-B12-VIT C-FA-IFC PO CAPS: 1.0000 | ORAL_CAPSULE | Freq: Two times a day (BID) | ORAL | 0 refills | Status: AC

## 2017-06-14 MED ORDER — ENOXAPARIN SODIUM 40 MG/0.4ML ~~LOC~~ SOLN: 40.0000 mg | SUBCUTANEOUS | 0 refills | Status: DC

## 2017-06-14 MED ORDER — RISPERIDONE 0.5 MG PO TABS
0.5000 mg | ORAL_TABLET | Freq: Once | ORAL | Status: DC
  Filled 2017-06-14: qty 1

## 2017-06-14 MED ORDER — DOCUSATE SODIUM 100 MG PO CAPS: 100.0000 mg | ORAL_CAPSULE | Freq: Two times a day (BID) | ORAL | 0 refills | Status: DC

## 2017-06-14 MED ORDER — RISPERIDONE 1 MG PO TABS: 1.0000 mg | ORAL_TABLET | Freq: Two times a day (BID) | ORAL | 0 refills | Status: DC | PRN

## 2017-06-14 MED ORDER — TRAMADOL HCL 50 MG PO TABS: 50.0000 mg | ORAL_TABLET | Freq: Four times a day (QID) | ORAL | 0 refills | Status: DC | PRN

## 2017-06-14 MED ORDER — BISACODYL 10 MG RE SUPP: 10.0000 mg | Freq: Every day | RECTAL | 0 refills | Status: DC | PRN

## 2017-06-14 NOTE — Progress Notes (Signed)
PT Cancellation Note  Patient Details Name: Glenda Stephens MRN: [id number] DOB: 05-May-1940   Cancelled Treatment:    Reason Eval/Treat Not Completed: Fatigue/lethargy limiting ability to participate(Treatment attempted this date.  Patient sleeping soundly-increased agitation, combativeness overnight requiring haldol administration. Per primary RN this date, recommend hold on this patient this AM.  Tentative discharge planned for this PM; will continue to follow as appropriate.)   Keanon Bevins H. Manson PasseyBrown, PT, DPT, NCS 06/14/17, 10:43 AM [phone]

## 2017-06-14 NOTE — Progress Notes (Signed)
Patient is being discharged. Alert to self only. Switched to [employer] wound vac. Report called. IV removed by Martie LeeSabrina, NT.  DNR and signed script in packet. Waiting for EMS at this time.

## 2017-06-14 NOTE — Discharge Summary (Signed)
[facility] [place] [facility]   PATIENT NAME: Glenda Stephens    MR#:  [id number]  DATE OF BIRTH:  December 24, 1940  DATE OF ADMISSION:  06/11/2017   ADMITTING PHYSICIAN: Arnaldo Natal, MD  DATE OF DISCHARGE:  06/14/17  PRIMARY CARE PHYSICIAN: Dione Housekeeper, MD   ADMISSION DIAGNOSIS:   Closed displaced fracture of left femoral neck (HCC) [S72.002A]  DISCHARGE DIAGNOSIS:   Active Problems:   Hip fracture (HCC)   SECONDARY DIAGNOSIS:   Past Medical History:  Diagnosis Date  . Chronic kidney disease   . Depression   . Diabetes mellitus without complication (HCC)   . Hypertension   . Myasthenia gravis without (acute) exacerbation (HCC)   . Obesity     HOSPITAL COURSE:   77 year old female with past medical history significant for hypertension, diabetes, COPD, myasthenia gravis who is living at an apartment with family, brought in after a fall and left femoral neck fracture  1. Left femoral neck fracture-postop day 2 after total hip arthroplasty of the left -Appreciate orthopedics consult. -Pain control, physical therapy consulted - will need rehabilitation at discharge. - Ortho recommendation at discharge:  Lovenox 40 mg subcu daily times 14 days at discharge Please apply TED hose bilateral lower extremities times 6 weeks, remove at nighttime. Follow-up with [facility] in 6 weeks for x-rays. Please remove provena negative pressure dressing on 06/21/2017 and apply honey comb dressing. Keep dressing clean and dry at all times. Remove staples and apply Steri-Strips on 06/26/2017  2. Acute delirium on dementia-patient still is confused - sun downing due to dementia -Monitor closely. Avoid ativan - added risperidone prn  3.acute blood loss postoperative anemia-no indication for transfusion. Stable hb now -iron supplements started -Hemovac removed  4. Dementia and depression-on Namenda, BuSpar  5. Hypothyroidism-continue  Synthroid  6. DVT prophylaxis-on Lovenox for 2 weeks  Physical therapy consulted Discharge to rehab when able to    DISCHARGE CONDITIONS:   Guarded  CONSULTS OBTAINED:   Treatment Team:  Kennedy Bucker, MD  DRUG ALLERGIES:   Allergies  Allergen Reactions  . Ativan [Lorazepam] Other (See Comments)    Hallicinations  . Botox [Botulinum Toxin Type A] Other (See Comments)    Reaction: unknown Daughter does not think patient has ever received  . Codeine Hives    Daughter states that the patient does not always react  . Dilaudid [Hydromorphone] Other (See Comments)    Hallicinations  . Magnesium-Containing Compounds Hives  . Penicillamine   . Penicillins Hives    Has patient had a PCN reaction causing immediate rash, facial/tongue/throat swelling, SOB or lightheadedness with hypotension: Yes Has patient had a PCN reaction causing severe rash involving mucus membranes or skin necrosis: No Has patient had a PCN reaction that required hospitalization: No Has patient had a PCN reaction occurring within the last 10 years: Unknown If all of the above answers are "NO", then may proceed with Cephalosporin use.    DISCHARGE MEDICATIONS:   Allergies as of 06/14/2017      Reactions   Ativan [lorazepam] Other (See Comments)   Hallicinations   Botox [botulinum Toxin Type A] Other (See Comments)   Reaction: unknown Daughter does not think patient has ever received   Codeine Hives   Daughter states that the patient does not always react   Dilaudid [hydromorphone] Other (See Comments)   Hallicinations   Magnesium-containing Compounds Hives   Penicillamine    Penicillins Hives   Has patient had a  PCN reaction causing immediate rash, facial/tongue/throat swelling, SOB or lightheadedness with hypotension: Yes Has patient had a PCN reaction causing severe rash involving mucus membranes or skin necrosis: No Has patient had a PCN reaction that required hospitalization: No Has patient  had a PCN reaction occurring within the last 10 years: Unknown If all of the above answers are "NO", then may proceed with Cephalosporin use.      Medication List    TAKE these medications   acetaminophen 650 MG CR tablet Commonly known as:  TYLENOL Take 650 mg by mouth every 8 (eight) hours as needed for pain.   bisacodyl 10 MG suppository Commonly known as:  DULCOLAX Place 1 suppository (10 mg total) rectally daily as needed for moderate constipation.   busPIRone 10 MG tablet Commonly known as:  BUSPAR Take 20 mg by mouth 2 (two) times daily.   CALCIUM 600+D 600-400 MG-UNIT tablet Generic drug:  Calcium Carbonate-Vitamin D Take 1 tablet by mouth daily.   docusate sodium 100 MG capsule Commonly known as:  COLACE Take 1 capsule (100 mg total) by mouth 2 (two) times daily.   enoxaparin 40 MG/0.4ML injection Commonly known as:  LOVENOX Inject 0.4 mLs (40 mg total) into the skin daily for 14 days.   ferrous fumarate-b12-vitamic C-folic acid capsule Commonly known as:  TRINSICON / FOLTRIN Take 1 capsule by mouth 2 (two) times daily.   furosemide 20 MG tablet Commonly known as:  LASIX Take 20 mg by mouth.   liothyronine 25 MCG tablet Commonly known as:  CYTOMEL Take 25 mcg by mouth daily.   Melatonin 5 MG Tabs Take 1 tablet by mouth at bedtime.   memantine 10 MG tablet Commonly known as:  NAMENDA Take 1 tablet by mouth 2 (two) times daily.   multivitamin tablet Take 1 tablet by mouth daily.   mycophenolate 500 MG tablet Commonly known as:  CELLCEPT Take 1,000 mg by mouth 2 (two) times daily.   oxybutynin 5 MG tablet Commonly known as:  DITROPAN Take 5 mg by mouth 3 (three) times daily.   potassium chloride 10 MEQ tablet Commonly known as:  K-DUR Take 10 mEq by mouth daily.   pyridostigmine 60 MG tablet Commonly known as:  MESTINON Take 1 tablet (60 mg total) by mouth 3 (three) times daily.   ranitidine 150 MG capsule Commonly known as:  ZANTAC Take  150 mg by mouth 2 (two) times daily.   risperiDONE 1 MG tablet Commonly known as:  RISPERDAL Take 1 tablet (1 mg total) by mouth 2 (two) times daily as needed (agitation).   sertraline 100 MG tablet Commonly known as:  ZOLOFT Take 100 mg by mouth daily.   traMADol 50 MG tablet Commonly known as:  ULTRAM Take 1 tablet (50 mg total) by mouth every 6 (six) hours as needed for up to 20 days.   traZODone 100 MG tablet Commonly known as:  DESYREL Take 100 mg by mouth at bedtime.   Vitamin D (Ergocalciferol) 50000 units Caps capsule Commonly known as:  DRISDOL Take 50,000 Units by mouth every 7 (seven) days.        DISCHARGE INSTRUCTIONS:   1. PCP f/u in 2 weeks 2. Ortho f/u as scheduled  DIET:   Regular diet  ACTIVITY:   Activity as tolerated  OXYGEN:   Home Oxygen: No.  Oxygen Delivery: room air  DISCHARGE LOCATION:   nursing home   If you experience worsening of your admission symptoms, develop shortness of breath, life  threatening emergency, suicidal or homicidal thoughts you must seek medical attention immediately by calling 911 or calling your MD immediately  if symptoms less severe.  You Must read complete instructions/literature along with all the possible adverse reactions/side effects for all the Medicines you take and that have been prescribed to you. Take any new Medicines after you have completely understood and accpet all the possible adverse reactions/side effects.   Please note  You were cared for by a hospitalist during your hospital stay. If you have any questions about your discharge medications or the care you received while you were in the hospital after you are discharged, you can call the unit and asked to speak with the hospitalist on call if the hospitalist that took care of you is not available. Once you are discharged, your primary care physician will handle any further medical issues. Please note that NO REFILLS for any discharge medications  will be authorized once you are discharged, as it is imperative that you return to your primary care physician (or establish a relationship with a primary care physician if you do not have one) for your aftercare needs so that they can reassess your need for medications and monitor your lab values.    On the day of Discharge:  VITAL SIGNS:   Blood pressure (!) 116/49, pulse 67, temperature 97.8 F (36.6 C), resp. rate 19, height 5\' 10"  (1.778 m), weight 103 kg (227 lb), SpO2 95 %.  PHYSICAL EXAMINATION:    GENERAL:  77 y.o.-year-old elderly patient lying in the bed with no acute distress.  EYES: Pupils equal, round, reactive to light and accommodation. No scleral icterus. Extraocular muscles intact.  HEENT: Head atraumatic, normocephalic. Oropharynx and nasopharynx clear.  NECK:  Supple, no jugular venous distention. No thyroid enlargement, no tenderness.  LUNGS: Normal breath sounds bilaterally, no wheezing, rales,rhonchi or crepitation. No use of accessory muscles of respiration. Decreased bibasilar breath sounds CARDIOVASCULAR: S1, S2 normal. No rubs, or gallops. 3/6 systolic murmur present ABDOMEN: Soft, nontender, nondistended. Bowel sounds present. No organomegaly or mass.  EXTREMITIES: status post left hip arthroplasty, negative pressure dressing in place.No pedal edema, cyanosis, or clubbing.  NEUROLOGIC: Cranial nerves II through XII are intact. No focal motor or sensory deficits.. Gait not checked.  PSYCHIATRIC: The patient is alert and oriented to self. Confused and occasional hallucinations SKIN: No obvious rash, lesion, or ulcer.    DATA REVIEW:   CBC Recent Labs  Lab 06/14/17 0944  WBC 9.7  HGB 10.9*  HCT 33.6*  PLT 136*    Chemistries  Recent Labs  Lab 06/12/17 0017  06/14/17 0944  NA 136   < > 137  K 3.8   < > 3.6  CL 104   < > 106  CO2 24   < > 21*  GLUCOSE 118*   < > 131*  BUN 20   < > 14  CREATININE 1.23*   < > 0.68  CALCIUM 9.3   < > 8.7*  AST  24  --   --   ALT 16  --   --   ALKPHOS 83  --   --   BILITOT 0.7  --   --    < > = values in this interval not displayed.     Microbiology Results  Results for orders placed or performed during the hospital encounter of 06/11/17  Surgical pcr screen     Status: None   Collection Time: 06/12/17 10:55 AM  Result Value  Ref Range Status   MRSA, PCR NEGATIVE NEGATIVE Final   Staphylococcus aureus NEGATIVE NEGATIVE Final    Comment: (NOTE) The Xpert SA Assay (FDA approved for NASAL specimens in patients 3 years of age and older), is one component of a comprehensive surveillance program. It is not intended to diagnose infection nor to guide or monitor treatment. Performed at [facility], [address], [place], [place] [address]     RADIOLOGY:  No results found.   Management plans discussed with the patient, family and they are in agreement.  CODE STATUS:     Code Status Orders  (From admission, onward)        Start     Ordered   06/12/17 1328  Do not attempt resuscitation (DNR)  Continuous    Question Answer Comment  In the event of cardiac or respiratory ARREST Do not call a "code blue"   In the event of cardiac or respiratory ARREST Do not perform Intubation, CPR, defibrillation or ACLS   In the event of cardiac or respiratory ARREST Use medication by any route, position, wound care, and other measures to relive pain and suffering. May use oxygen, suction and manual treatment of airway obstruction as needed for comfort.   Comments discussed with daughter HCPOA at bedside      06/12/17 1327    Code Status History    Date Active Date Inactive Code Status Order ID Comments User Context   06/12/2017 03:16 06/12/2017 13:27 Full Code [id number]  Arnaldo Natal, MD Inpatient   06/04/2016 21:38 06/05/2016 21:40 Full Code [id number]  Milagros Loll, MD ED    Advance Directive Documentation     Most Recent Value  Type of Advance Directive  Healthcare Power of  Attorney  Pre-existing out of facility DNR order (yellow form or pink MOST form)  No data  "MOST" Form in Place?  No data      TOTAL TIME TAKING CARE OF THIS PATIENT: 38 minutes.    Enid Baas M.D on 06/14/2017 at 11:33 AM  Between 7am to 6pm - Pager - [phone]  After 6pm go to [email or web address] - Social research officer, government  [facility]  Office  [phone]  CC: Primary care physician; Dione Housekeeper, MD   Note: This dictation was prepared with Dragon dictation along with smaller phrase technology. Any transcriptional errors that result from this process are unintentional.

## 2017-06-14 NOTE — Clinical Social Work Placement (Signed)
   CLINICAL SOCIAL WORK PLACEMENT  NOTE  Date:  06/14/2017  Patient Details  Name: Glenda Stephens MRN: [id number] Date of Birth: [birth year]  Clinical Social Work is seeking post-discharge placement for this patient at the Skilled  Nursing Facility level of care (*CSW will initial, date and re-position this form in  chart as items are completed):  Yes   Patient/family provided with [facility] Clinical Social Work Department's list of facilities offering this level of care within the geographic area requested by the patient (or if unable, by the patient's family).  Yes   Patient/family informed of their freedom to choose among providers that offer the needed level of care, that participate in Medicare, Medicaid or managed care program needed by the patient, have an available bed and are willing to accept the patient.  Yes   Patient/family informed of [facility]'s ownership interest in [facility] and [facility], as well as of the fact that they are under no obligation to receive care at these facilities.  PASRR submitted to EDS on       PASRR number received on       Existing PASRR number confirmed on 06/12/17     FL2 transmitted to all facilities in geographic area requested by pt/family on 06/12/17     FL2 transmitted to all facilities within larger geographic area on       Patient informed that his/her managed care company has contracts with or will negotiate with certain facilities, including the following:        Yes   Patient/family informed of bed offers received.  Patient [facility] bed at [facility] )     Physician recommends and patient chooses bed at      Patient to be transferred to [facility] ) on 06/14/17.  Patient to be [facility]     Patient family notified on 06/14/17 of transfer.  Name of family member notified:  (Patient's daughter Harriett Sineancy is aware of D/C today. )     PHYSICIAN       Additional  Comment:    _______________________________________________ Glenda Stephens, Glenda CrockerBailey Stephens, Glenda Stephens 06/14/2017, 1:01 PM

## 2017-06-14 NOTE — Progress Notes (Signed)
Patient is medically stable for [facility]. Per [facility] admissions coordinator at [facility] [employer] [facility] authorization has been received and patient can come today to room 203-B. RN will call report at [phone] and arrange EMS for transport. Clinical Child psychotherapistocial Worker (CSW) sent [facility] via [employer]. CSW contacted patient's daughter Harriett Sineancy and made her aware of above. Please reconsult if future social work needs arise. CSW signing off.   [employer]Bailey Jovanne Riggenbach, LCSW [phone]

## 2017-06-14 NOTE — Progress Notes (Addendum)
   Subjective: 2 Days Post-Op Procedure(s) (LRB): TOTAL HIP ARTHROPLASTY ANTERIOR APPROACH (Left) Patient lying in the bed, appears slightly agitated Patient denies having any pain. We will start therapy today.  Plan is to go Skilled nursing facility after hospital stay.  Objective: Vital signs in last 24 hours: Temp:  [97.8 F (36.6 C)-98.1 F (36.7 C)] 97.8 F (36.6 C) (03/14 2016) Pulse Rate:  [61-110] 67 (03/14 2016) Resp:  [18-19] 19 (03/14 2016) BP: (114-116)/(42-49) 116/49 (03/14 2016) SpO2:  [94 %-96 %] 95 % (03/14 2016)  Intake/Output from previous day: 03/14 0701 - 03/15 0700 In: 1020 [P.O.:120; I.V.:725; IV Piggyback:100] Out: 600 [Urine:200; Drains:400] Intake/Output this shift: No intake/output data recorded.  Recent Labs    06/12/17 0017 06/13/17 0418  HGB 13.7 11.0*   Recent Labs    06/12/17 0017 06/13/17 0418  WBC 5.6 7.0  RBC 4.33 3.38*  HCT 41.9 33.0*  PLT 160 110*   Recent Labs    06/12/17 0017 06/13/17 0418  NA 136 137  K 3.8 4.3  CL 104 110  CO2 24 20*  BUN 20 17  CREATININE 1.23* 0.84  GLUCOSE 118* 160*  CALCIUM 9.3 8.3*   No results for input(s): LABPT, INR in the last 72 hours.  EXAM General - Patient is Alert, Appropriate and Confused. Appears comfortable, no distress Extremity - Neurovascular intact Sensation intact distally Intact pulses distally Dorsiflexion/Plantar flexion intact No cellulitis present Compartment soft Dressing - dressing C/D/I and no drainage.  Wound VAC intact with no drainage.  Hemovac removed yesterday. Motor Function - intact, moving foot and toes well on exam.   Past Medical History:  Diagnosis Date  . Chronic kidney disease   . Depression   . Diabetes mellitus without complication (HCC)   . Hypertension   . Myasthenia gravis without (acute) exacerbation (HCC)   . Obesity     Assessment/Plan:   2 Days Post-Op Procedure(s) (LRB): TOTAL HIP ARTHROPLASTY ANTERIOR APPROACH (Left) Active  Problems:   Hip fracture (HCC)  Estimated body mass index is 32.57 kg/m as calculated from the following:   Height as of this encounter: 5\' 10"  (1.778 m).   Weight as of this encounter: 227 lb (103 kg). Advance diet Up with therapy  Needs BM Acute post op blood loss anemia - Hgb 11.0.  Iron started yesterday.   Labs pending this morning CM to assist with discharge to SNF Discharge to skilled nursing facility pending medical discharge. Patient tolerating Tylenol well.  Try to avoid narcotics.   Lovenox 40 mg subcu daily times 14 days at discharge Please apply TED hose bilateral lower extremities times 6 weeks, remove at nighttime. Follow-up with [facility] in 6 weeks for x-rays. Please remove provena negative pressure dressing on 06/21/2017 and apply honey comb dressing. Keep dressing clean and dry at all times. Remove staples and apply Steri-Strips on 06/26/2017   DVT Prophylaxis - Lovenox, Foot Pumps and TED hose Weight-Bearing as tolerated to left leg   T. Cranston Neighborhris Gaines, PA-C [facility] 06/14/2017, 7:52 AM

## 2017-06-15 DIAGNOSIS — R197 Diarrhea, unspecified: Secondary | ICD-10-CM | POA: Diagnosis not present

## 2017-06-15 LAB — C DIFFICILE QUICK SCREEN W PCR REFLEX
C DIFFICILE (CDIFF) INTERP: NOT DETECTED
C DIFFICILE (CDIFF) TOXIN: NEGATIVE
C DIFFICLE (CDIFF) ANTIGEN: NEGATIVE

## 2017-06-25 ENCOUNTER — Non-Acute Institutional Stay (SKILLED_NURSING_FACILITY): Payer: Medicare HMO | Admitting: Gerontology

## 2017-06-25 ENCOUNTER — Other Ambulatory Visit
Admission: RE | Admit: 2017-06-25 | Discharge: 2017-06-25 | Disposition: A | Discharge status: Home / Self Care | Payer: Medicare HMO | Source: Skilled Nursing Facility | Attending: Internal Medicine | Admitting: Internal Medicine

## 2017-06-25 DIAGNOSIS — G301 Alzheimer's disease with late onset: Secondary | ICD-10-CM

## 2017-06-25 DIAGNOSIS — Z96642 Presence of left artificial hip joint: Secondary | ICD-10-CM | POA: Diagnosis not present

## 2017-06-25 DIAGNOSIS — F0281 Dementia in other diseases classified elsewhere with behavioral disturbance: Secondary | ICD-10-CM | POA: Diagnosis not present

## 2017-06-25 DIAGNOSIS — S72002D Fracture of unspecified part of neck of left femur, subsequent encounter for closed fracture with routine healing: Secondary | ICD-10-CM | POA: Diagnosis not present

## 2017-06-25 DIAGNOSIS — F0280 Dementia in other diseases classified elsewhere without behavioral disturbance: Secondary | ICD-10-CM | POA: Insufficient documentation

## 2017-06-25 DIAGNOSIS — F02818 Dementia in other diseases classified elsewhere, unspecified severity, with other behavioral disturbance: Secondary | ICD-10-CM | POA: Insufficient documentation

## 2017-06-25 DIAGNOSIS — M80052D Age-related osteoporosis with current pathological fracture, left femur, subsequent encounter for fracture with routine healing: Secondary | ICD-10-CM | POA: Diagnosis present

## 2017-06-25 LAB — COMPREHENSIVE METABOLIC PANEL
ALBUMIN: 2.9 g/dL — AB (ref 3.5–5.0)
ALT: 15 U/L (ref 14–54)
ANION GAP: 9 (ref 5–15)
AST: 21 U/L (ref 15–41)
Alkaline Phosphatase: 109 U/L (ref 38–126)
BILIRUBIN TOTAL: 0.5 mg/dL (ref 0.3–1.2)
BUN: 16 mg/dL (ref 6–20)
CO2: 29 mmol/L (ref 22–32)
Calcium: 8.9 mg/dL (ref 8.9–10.3)
Chloride: 103 mmol/L (ref 101–111)
Creatinine, Ser: 0.82 mg/dL (ref 0.44–1.00)
GFR calc Af Amer: >60 mL/min (ref >60)
GFR calc non Af Amer: >60 mL/min (ref >60)
GLUCOSE: 131 mg/dL — AB (ref 65–99)
POTASSIUM: 3.6 mmol/L (ref 3.5–5.1)
SODIUM: 141 mmol/L (ref 135–145)
Total Protein: 5.9 g/dL — ABNORMAL LOW (ref 6.5–8.1)

## 2017-06-25 LAB — CBC
HCT: 32.2 % — ABNORMAL LOW (ref 35.0–47.0)
Hemoglobin: 10.5 g/dL — ABNORMAL LOW (ref 12.0–16.0)
MCH: 32.3 pg (ref 26.0–34.0)
MCHC: 32.7 g/dL (ref 32.0–36.0)
MCV: 98.9 fL (ref 80.0–100.0)
Platelets: 276 10*3/uL (ref 150–440)
RBC: 3.25 MIL/uL — AB (ref 3.80–5.20)
RDW: 14.3 % (ref 11.5–14.5)
WBC: 5.9 10*3/uL (ref 3.6–11.0)

## 2017-06-25 LAB — MAGNESIUM: Magnesium: 2 mg/dL (ref 1.7–2.4)

## 2017-06-25 NOTE — Progress Notes (Signed)
Location:      Place of Service:  Nursing [phone]) Provider:  Toni Arthurs, NP-C  Olmedo, Guy Begin, MD  Patient Care Team: Valera Castle, MD as PCP - General (Family Medicine)  Extended Emergency Contact Information Primary Emergency Contact: Avie Arenas Address: [address]          [place], [place] [address] Johnnette Litter of [place] Phone: [phone] Mobile Phone: [phone] Relation: Daughter Secondary Emergency Contact: Elmo Putt [place] of [place] Mobile Phone: [phone] Relation: Daughter  Code Status: DNR Goals of care: Advanced Directive information Advanced Directives 06/12/2017  Does Patient Have a Medical Advance Directive? Yes  Type of Advance Directive [place]  Does patient want to make changes to medical advance directive? No - Patient declined  Would patient like information on creating a medical advance directive? No - Patient declined     Chief Complaint  Patient presents with  . Hospitalization Follow-up    HPI:  Pt is a 77 y.o. female seen today for medical management of chronic diseases.  Patient was admitted to the facility for rehab following hospitalization at [facility] for fall with left hip fracture and subsequent total hip replacement, anterior approach.  Patient has been somewhat participating in PT/OT.  Since admission, patient has been confused with combativeness at times.  Nurses describe patient's behavior stating she is calm and relaxed and cooperative and then will suddenly change and began yelling, screaming at staff and being combative, refusing care.  Staff is keeping patient up at the nurses station during the day for close observation, as patient repeatedly attempts to get out of the bed or chair unassisted.  Patient is redirectable, but then quickly forgets instructions.  During assessment, patient was pleasantly confused, talking nonsensical.  However, she was rubbing her left thigh and  said the leg hurts "all the time."  Patient continues on Lovenox 40 mg subcu daily DVT prophylaxis.  Incision is well approximated, staples intact, CDI.  Bilateral calves soft, supple.  Negative Homans sign.  Bilateral pedal pulses equal and strong.  Staff are documenting good p.o. food and fluid intake with regular BMs, voiding without difficulty.  Patient is incontinent of bowel and bladder.  Will adjust pain medications for better pain control and possibly better behavior management.  Vital signs stable.  No other complaints.  Please note pt with limited verbal/cognitive ability. Unable to obtain complete ROS. Some ROS info obtained from staff and documentation.      Past Medical History:  Diagnosis Date  . Chronic kidney disease   . Depression   . Diabetes mellitus without complication ([place])   . Hypertension   . Myasthenia gravis without (acute) exacerbation ([place])   . Obesity    Past Surgical History:  Procedure Laterality Date  . ABDOMINAL HYSTERECTOMY    . CERVICAL SPINE SURGERY    . CHOLECYSTECTOMY    . LUMBAR SPINE SURGERY    . TOTAL HIP ARTHROPLASTY Left 06/12/2017   Procedure: TOTAL HIP ARTHROPLASTY ANTERIOR APPROACH;  Surgeon: Hessie Knows, MD;  Location: [facility] ORS;  Service: Orthopedics;  Laterality: Left;    Allergies  Allergen Reactions  . Ativan [Lorazepam] Other (See Comments)    Hallicinations  . Botox [Botulinum Toxin Type A] Other (See Comments)    Reaction: unknown Daughter does not think patient has ever received  . Codeine Hives    Daughter states that the patient does not always react  . Dilaudid [Hydromorphone] Other (See Comments)  Hallicinations  . Magnesium-Containing Compounds Hives  . Penicillamine   . Penicillins Hives    Has patient had a PCN reaction causing immediate rash, facial/tongue/throat swelling, SOB or lightheadedness with hypotension: Yes Has patient had a PCN reaction causing severe rash involving mucus membranes or skin necrosis:  No Has patient had a PCN reaction that required hospitalization: No Has patient had a PCN reaction occurring within the last 10 years: Unknown If all of the above answers are "NO", then may proceed with Cephalosporin use.     Allergies as of 06/25/2017      Reactions   Ativan [lorazepam] Other (See Comments)   Hallicinations   Botox [botulinum Toxin Type A] Other (See Comments)   Reaction: unknown Daughter does not think patient has ever received   Codeine Hives   Daughter states that the patient does not always react   Dilaudid [hydromorphone] Other (See Comments)   Hallicinations   Magnesium-containing Compounds Hives   Penicillamine    Penicillins Hives   Has patient had a PCN reaction causing immediate rash, facial/tongue/throat swelling, SOB or lightheadedness with hypotension: Yes Has patient had a PCN reaction causing severe rash involving mucus membranes or skin necrosis: No Has patient had a PCN reaction that required hospitalization: No Has patient had a PCN reaction occurring within the last 10 years: Unknown If all of the above answers are "NO", then may proceed with Cephalosporin use.      Medication List        Accurate as of 06/25/17 12:53 PM. Always use your most recent med list.          acetaminophen 650 MG CR tablet Commonly known as:  TYLENOL Take 650 mg by mouth every 8 (eight) hours as needed for pain.   bisacodyl 10 MG suppository Commonly known as:  DULCOLAX Place 1 suppository (10 mg total) rectally daily as needed for moderate constipation.   busPIRone 10 MG tablet Commonly known as:  BUSPAR Take 20 mg by mouth 2 (two) times daily.   CALCIUM 600+D 600-400 MG-UNIT tablet Generic drug:  Calcium Carbonate-Vitamin D Take 1 tablet by mouth daily.   docusate sodium 100 MG capsule Commonly known as:  COLACE Take 1 capsule (100 mg total) by mouth 2 (two) times daily.   enoxaparin 40 MG/0.4ML injection Commonly known as:  LOVENOX Inject 0.4 mLs  (40 mg total) into the skin daily for 14 days.   ferrous [id number] C-folic acid capsule Commonly known as:  TRINSICON / FOLTRIN Take 1 capsule by mouth 2 (two) times daily.   furosemide 20 MG tablet Commonly known as:  LASIX Take 20 mg by mouth.   liothyronine 25 MCG tablet Commonly known as:  CYTOMEL Take 25 mcg by mouth daily.   Melatonin 5 MG Tabs Take 1 tablet by mouth at bedtime.   memantine 10 MG tablet Commonly known as:  NAMENDA Take 1 tablet by mouth 2 (two) times daily.   multivitamin tablet Take 1 tablet by mouth daily.   mycophenolate 500 MG tablet Commonly known as:  CELLCEPT Take 1,000 mg by mouth 2 (two) times daily.   oxybutynin 5 MG tablet Commonly known as:  DITROPAN Take 5 mg by mouth 3 (three) times daily.   potassium chloride 10 MEQ tablet Commonly known as:  K-DUR Take 10 mEq by mouth daily.   pyridostigmine 60 MG tablet Commonly known as:  MESTINON Take 1 tablet (60 mg total) by mouth 3 (three) times daily.   ranitidine 150 MG  capsule Commonly known as:  ZANTAC Take 150 mg by mouth 2 (two) times daily.   risperiDONE 1 MG tablet Commonly known as:  RISPERDAL Take 1 tablet (1 mg total) by mouth 2 (two) times daily as needed (agitation).   sertraline 100 MG tablet Commonly known as:  ZOLOFT Take 100 mg by mouth daily.   traMADol 50 MG tablet Commonly known as:  ULTRAM Take 1 tablet (50 mg total) by mouth every 6 (six) hours as needed for up to 20 days.   traZODone 100 MG tablet Commonly known as:  DESYREL Take 100 mg by mouth at bedtime.   Vitamin D (Ergocalciferol) 50000 units Caps capsule Commonly known as:  DRISDOL Take 50,000 Units by mouth every 7 (seven) days.       Review of Systems  Unable to perform ROS: Dementia  Constitutional: Negative for activity change, appetite change, chills, diaphoresis and fever.  HENT: Negative.   Respiratory: Negative for cough, choking, chest tightness and shortness of breath.    Cardiovascular: Negative for chest pain and leg swelling.  Gastrointestinal: Negative for abdominal pain, constipation, diarrhea and nausea.  Genitourinary: Negative.   Musculoskeletal: Positive for arthralgias (typical arthritis) and myalgias. Negative for back pain and gait problem.  Skin: Positive for wound.  Neurological: Positive for weakness. Negative for numbness and headaches.  Psychiatric/Behavioral: Positive for agitation, behavioral problems, confusion and hallucinations.  All other systems reviewed and are negative.    There is no immunization history on file for this patient. There are no preventive care reminders to display for this patient. No flowsheet data found. Functional Status Survey:    Vitals:   06/24/17 2230  BP: 135/62  Pulse: 80  Resp: 20  Temp: 98 F (36.7 C)  SpO2: 98%  Weight: 205 lb 6.4 oz (93.2 kg)   Body mass index is 29.47 kg/m. Physical Exam  Constitutional: Vital signs are normal. She appears well-developed and well-nourished. She is active and cooperative. She does not appear ill. No distress.  HENT:  Head: Normocephalic and atraumatic.  Mouth/Throat: Uvula is midline, oropharynx is clear and moist and mucous membranes are normal. Mucous membranes are not pale, not dry and not cyanotic.  Eyes: Pupils are equal, round, and reactive to light. Conjunctivae, EOM and lids are normal.  Neck: Trachea normal, normal range of motion and full passive range of motion without pain. Neck supple. No JVD present. No tracheal deviation, no edema and no erythema present. No thyromegaly present.  Cardiovascular: Normal rate, regular rhythm, normal heart sounds, intact distal pulses and normal pulses. Exam reveals no gallop, no distant heart sounds and no friction rub.  No murmur heard. Pulses:      Dorsalis pedis pulses are 2+ on the right side, and 2+ on the left side.  No edema  Pulmonary/Chest: Effort normal and breath sounds normal. No accessory muscle  usage. No respiratory distress. She has no decreased breath sounds. She has no wheezes. She has no rhonchi. She has no rales. She exhibits no tenderness.  Abdominal: Soft. Normal appearance and bowel sounds are normal. She exhibits no distension and no ascites. There is no tenderness.  Musculoskeletal: She exhibits no edema or tenderness.       Left hip: She exhibits decreased range of motion, decreased strength and laceration.  Expected osteoarthritis, stiffness; Bilateral Calves soft, supple. Negative Homan's Sign. B- pedal pulses equal; left hip fracture with anterior total hip arthroplasty  Neurological: She is alert. She has normal strength. She is disoriented.  A cranial nerve deficit and sensory deficit is present. Coordination and gait abnormal.  Skin: Skin is warm and dry. Laceration (left hip incision) noted. She is not diaphoretic. No cyanosis. No pallor. Nails show no clubbing.  Psychiatric: Her speech is normal. Her mood appears anxious. She is agitated, actively hallucinating and combative. Thought content is delusional. Cognition and memory are impaired. She expresses impulsivity and inappropriate judgment. She exhibits a depressed mood. She exhibits abnormal recent memory and abnormal remote memory.  Nursing note and vitals reviewed.   Labs reviewed: Recent Labs    06/12/17 0017 06/13/17 0418 06/14/17 0944  NA 136 137 137  K 3.8 4.3 3.6  CL 104 110 106  CO2 24 20* 21*  GLUCOSE 118* 160* 131*  BUN _0 CREATININE 1.23* 0.84 0.68  CALCIUM 9.3 8.3* 8.7*   Recent Labs    06/12/17 0017  AST 24  ALT 16  ALKPHOS 83  BILITOT 0.7  PROT 7.0  ALBUMIN 3.9   Recent Labs    06/12/17 0017 06/13/17 0418 06/14/17 0944  WBC 5.6 7.0 9.7  HGB 13.7 11.0* 10.9*  HCT 41.9 33.0* 33.6*  MCV 96.9 97.7 96.5  PLT 160 110* 136*   Lab Results  Component Value Date   TSH 3.514 06/12/2017   Lab Results  Component Value Date   HGBA1C 5.0 06/12/2017   No results found for:  CHOL, HDL, LDLCALC, LDLDIRECT, TRIG, CHOLHDL  Significant Diagnostic Results in last 30 days:  Dg Chest 1 View  Result Date: 06/11/2017 CLINICAL DATA:  77 year old female with hip fracture. Preop evaluation. EXAM: CHEST  1 VIEW COMPARISON:  Chest radiograph dated 06/04/2016 FINDINGS: The lungs are clear. There is no pleural effusion or pneumothorax. Mild cardiomegaly. The aorta is tortuous. No acute osseous pathology. IMPRESSION: No active disease. Electronically Signed   By: Anner Crete M.D.   On: 06/11/2017 23:37   Dg Pelvis 1-2 Views  Result Date: 05/26/2017 CLINICAL DATA:  Low back pain EXAM: PELVIS - 1-2 VIEW COMPARISON:  CT 12/19/2012 FINDINGS: Mild degenerative changes in the hips bilaterally with joint space narrowing and spurring. No acute bony abnormality. Specifically, no fracture, subluxation, or dislocation. IMPRESSION: No acute bony abnormality. Electronically Signed   By: Rolm Baptise M.D.   On: 05/26/2017 22:04   Dg Wrist Complete Right  Result Date: 05/26/2017 CLINICAL DATA:  Fall, wrist pain EXAM: RIGHT WRIST - COMPLETE 3+ VIEW COMPARISON:  None. FINDINGS: Advanced osteoarthritic changes at the 1st carpometacarpal joint. Moderate degenerative changes throughout the wrist. No acute bony abnormality. Specifically, no fracture, subluxation, or dislocation. IMPRESSION: No acute bony abnormality. Electronically Signed   By: Rolm Baptise M.D.   On: 05/26/2017 22:05   Ct Head Wo Contrast  Result Date: 05/26/2017 CLINICAL DATA:  Head trauma. EXAM: CT HEAD WITHOUT CONTRAST CT CERVICAL SPINE WITHOUT CONTRAST TECHNIQUE: Multidetector CT imaging of the head and cervical spine was performed following the standard protocol without intravenous contrast. Multiplanar CT image reconstructions of the cervical spine were also generated. COMPARISON:  None. FINDINGS: CT HEAD FINDINGS Brain: There is atrophy and chronic small vessel disease changes. No acute intracranial abnormality. Specifically,  no hemorrhage, hydrocephalus, mass lesion, acute infarction, or significant intracranial injury. Vascular: No hyperdense vessel or unexpected calcification. Skull: No acute calvarial abnormality. Sinuses/Orbits: Visualized paranasal sinuses and mastoids clear. Orbital soft tissues unremarkable. Other: None CT CERVICAL SPINE FINDINGS Alignment: Normal Skull base and vertebrae: No fracture Soft tissues and spinal canal: Prevertebral soft tissues are normal.  No epidural or paraspinal hematoma. Disc levels: Degenerative disc disease with disc space narrowing and spurring at C5-6 and C6-7. Diffuse bilateral degenerative facet disease. Upper chest: No acute findings Other: Carotid artery calcifications bilaterally. IMPRESSION: No acute intracranial abnormality. Atrophy, chronic microvascular disease. Degenerative disc and facet disease in the cervical spine. No acute findings. Electronically Signed   By: Rolm Baptise M.D.   On: 05/26/2017 22:03   Ct Cervical Spine Wo Contrast  Result Date: 05/26/2017 CLINICAL DATA:  Head trauma. EXAM: CT HEAD WITHOUT CONTRAST CT CERVICAL SPINE WITHOUT CONTRAST TECHNIQUE: Multidetector CT imaging of the head and cervical spine was performed following the standard protocol without intravenous contrast. Multiplanar CT image reconstructions of the cervical spine were also generated. COMPARISON:  None. FINDINGS: CT HEAD FINDINGS Brain: There is atrophy and chronic small vessel disease changes. No acute intracranial abnormality. Specifically, no hemorrhage, hydrocephalus, mass lesion, acute infarction, or significant intracranial injury. Vascular: No hyperdense vessel or unexpected calcification. Skull: No acute calvarial abnormality. Sinuses/Orbits: Visualized paranasal sinuses and mastoids clear. Orbital soft tissues unremarkable. Other: None CT CERVICAL SPINE FINDINGS Alignment: Normal Skull base and vertebrae: No fracture Soft tissues and spinal canal: Prevertebral soft tissues are  normal. No epidural or paraspinal hematoma. Disc levels: Degenerative disc disease with disc space narrowing and spurring at C5-6 and C6-7. Diffuse bilateral degenerative facet disease. Upper chest: No acute findings Other: Carotid artery calcifications bilaterally. IMPRESSION: No acute intracranial abnormality. Atrophy, chronic microvascular disease. Degenerative disc and facet disease in the cervical spine. No acute findings. Electronically Signed   By: Rolm Baptise M.D.   On: 05/26/2017 22:03   Ct Thoracic Spine Wo Contrast  Result Date: 05/26/2017 CLINICAL DATA:  Dresser fell on patient. Injury. History of dementia and discitis osteomyelitis. EXAM: CT THORACIC AND LUMBAR SPINE WITHOUT CONTRAST TECHNIQUE: Multidetector CT imaging of the thoracic and lumbar spine was performed without contrast. Multiplanar CT image reconstructions were also generated. COMPARISON:  MRI of the lumbar spine February 04, 2013 FINDINGS: CT THORACIC SPINE FINDINGS ALIGNMENT: Maintained thoracic lordosis. No malalignment. VERTEBRAE: Sequelae of old T12-L1 discitis osteomyelitis with progressed height loss from prior MRI, interbody fusion. Remaining disc heights generally preserved. Bridging ventral osteophytes. Osteopenia. No destructive bony lesions. Scattered old Schmorl's nodes. PARASPINAL AND OTHER SOFT TISSUES: Nonacute. Heterogeneous thyroid without dominant nodule. Cardiomegaly. Calcific atherosclerosis aortic arch. DISC LEVELS: No osseous canal stenosis.  Mild T8-9 neural foraminal narrowing. CT LUMBAR SPINE FINDINGS SEGMENTATION: For the purposes of this report the last well-formed intervertebral disc space is reported as L5-S1. ALIGNMENT: Maintained lumbar lordosis. Mild kyphosis T12-L1. Minimal grade 1 L5-S1 anterolisthesis without spondylolysis. VERTEBRAE: T12-L1 auto interbody arthrodesis, sequelae of old osteomyelitis. Mild new though nonacute L3 compression fracture with superior endplate Schmorl's node. Additional old  Schmorl's nodes. No acute fracture. Mild L4-5 disc height loss unchanged. Multilevel vacuum disc and moderate ventral endplate spurring. Osteopenia without destructive bony lesions. PARASPINAL AND OTHER SOFT TISSUES: Nonacute. Small air-filled hiatal hernia. Pneumobilia, status post cholecystectomy. 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 Colonic diverticulosis incompletely imaged. DISC LEVELS: T12-L1: Moderate broad-based disc osteophyte complex, mild canal stenosis. Moderate to severe RIGHT, moderate LEFT osseous neural foraminal narrowing. L1-2: Small broad-based disc osteophyte complex, no canal stenosis. Mild neural foraminal narrowing. L2-3: Small broad-based disc osteophyte complex, mild facet arthropathy and ligamentum flavum redundancy. No canal stenosis. Mild bilateral neural foraminal narrowing. L3-4: Large disc osteophyte complex asymmetric to the LEFT. Mild to moderate facet arthropathy and ligamentum flavum redundancy. Mild canal stenosis. Moderate RIGHT,  moderate to severe LEFT neural foraminal narrowing. L4-5: Moderate broad-based disc osteophyte complex. Moderate facet arthropathy and ligamentum flavum redundancy. Mild canal stenosis. Severe RIGHT, moderate to severe LEFT neural foraminal narrowing. L5-S1: Anterolisthesis. Severe facet arthropathy with ligamentum flavum redundancy. Small broad-based disc osteophyte complex. No canal stenosis. Moderate to severe bilateral neural foraminal narrowing. IMPRESSION: CT THORACIC SPINE IMPRESSION 1. No acute fracture or malalignment. 2. Old, healed T12-L1 discitis osteomyelitis with interbody fusion and mild kyphosis. 3. Osteopenia. CT LUMBAR SPINE IMPRESSION 1. No acute fracture. Stable grade 1 L5-S1 anterolisthesis without spondylolysis. Old mild T3 superior endplate compression fracture. 2. Degenerative change of the lumbar spine resulting in mild canal stenosis L3-4 and L4-5. 3. Neural foraminal narrowing L2-3 through  L5-S1: Severe on the RIGHT at L4-5. Aortic Atherosclerosis (ICD10-I70.0). Electronically Signed   By: Elon Alas M.D.   On: 05/26/2017 22:09   Ct Lumbar Spine Wo Contrast  Result Date: 05/26/2017 CLINICAL DATA:  Dresser fell on patient. Injury. History of dementia and discitis osteomyelitis. EXAM: CT THORACIC AND LUMBAR SPINE WITHOUT CONTRAST TECHNIQUE: Multidetector CT imaging of the thoracic and lumbar spine was performed without contrast. Multiplanar CT image reconstructions were also generated. COMPARISON:  MRI of the lumbar spine February 04, 2013 FINDINGS: CT THORACIC SPINE FINDINGS ALIGNMENT: Maintained thoracic lordosis. No malalignment. VERTEBRAE: Sequelae of old T12-L1 discitis osteomyelitis with progressed height loss from prior MRI, interbody fusion. Remaining disc heights generally preserved. Bridging ventral osteophytes. Osteopenia. No destructive bony lesions. Scattered old Schmorl's nodes. PARASPINAL AND OTHER SOFT TISSUES: Nonacute. Heterogeneous thyroid without dominant nodule. Cardiomegaly. Calcific atherosclerosis aortic arch. DISC LEVELS: No osseous canal stenosis.  Mild T8-9 neural foraminal narrowing. CT LUMBAR SPINE FINDINGS SEGMENTATION: For the purposes of this report the last well-formed intervertebral disc space is reported as L5-S1. ALIGNMENT: Maintained lumbar lordosis. Mild kyphosis T12-L1. Minimal grade 1 L5-S1 anterolisthesis without spondylolysis. VERTEBRAE: T12-L1 auto interbody arthrodesis, sequelae of old osteomyelitis. Mild new though nonacute L3 compression fracture with superior endplate Schmorl's node. Additional old Schmorl's nodes. No acute fracture. Mild L4-5 disc height loss unchanged. Multilevel vacuum disc and moderate ventral endplate spurring. Osteopenia without destructive bony lesions. PARASPINAL AND OTHER SOFT TISSUES: Nonacute. Small air-filled hiatal hernia. Pneumobilia, status post cholecystectomy. Severe calcific atherosclerosis infrarenal aorta with  chronic dissection. Colonic diverticulosis incompletely imaged. DISC LEVELS: T12-L1: Moderate broad-based disc osteophyte complex, mild canal stenosis. Moderate to severe RIGHT, moderate LEFT osseous neural foraminal narrowing. L1-2: Small broad-based disc osteophyte complex, no canal stenosis. Mild neural foraminal narrowing. L2-3: Small broad-based disc osteophyte complex, mild facet arthropathy and ligamentum flavum redundancy. No canal stenosis. Mild bilateral neural foraminal narrowing. L3-4: Large disc osteophyte complex asymmetric to the LEFT. Mild to moderate facet arthropathy and ligamentum flavum redundancy. Mild canal stenosis. Moderate RIGHT, moderate to severe LEFT neural foraminal narrowing. L4-5: Moderate broad-based disc osteophyte complex. Moderate facet arthropathy and ligamentum flavum redundancy. Mild canal stenosis. Severe RIGHT, moderate to severe LEFT neural foraminal narrowing. L5-S1: Anterolisthesis. Severe facet arthropathy with ligamentum flavum redundancy. Small broad-based disc osteophyte complex. No canal stenosis. Moderate to severe bilateral neural foraminal narrowing. IMPRESSION: CT THORACIC SPINE IMPRESSION 1. No acute fracture or malalignment. 2. Old, healed T12-L1 discitis osteomyelitis with interbody fusion and mild kyphosis. 3. Osteopenia. CT LUMBAR SPINE IMPRESSION 1. No acute fracture. Stable grade 1 L5-S1 anterolisthesis without spondylolysis. Old mild T3 superior endplate compression fracture. 2. Degenerative change of the lumbar spine resulting in mild canal stenosis L3-4 and L4-5. 3. Neural foraminal narrowing L2-3 through L5-S1: Severe on the  RIGHT at L4-5. Aortic Atherosclerosis (ICD10-I70.0). Electronically Signed   By: Elon Alas M.D.   On: 05/26/2017 22:09   Dg Hand 2 View Right  Result Date: 05/26/2017 CLINICAL DATA:  Fall EXAM: RIGHT HAND - 2 VIEW COMPARISON:  05/26/2017 wrist series FINDINGS: Advanced osteoarthritic changes at the 1st carpometacarpal  joint. Moderate degenerative changes throughout the wrist and IP joints. No acute fracture, subluxation or dislocation. IMPRESSION: No acute bony abnormality. Electronically Signed   By: Rolm Baptise M.D.   On: 05/26/2017 22:05   Dg Hip Operative Unilat W Or W/o Pelvis Left  Result Date: 06/12/2017 CLINICAL DATA:  Hip replacement EXAM: OPERATIVE LEFT HIP (WITH PELVIS IF PERFORMED) 6 VIEWS TECHNIQUE: Fluoroscopic spot image(s) were submitted for interpretation post-operatively. COMPARISON:  06/11/2017 FINDINGS: Multiple intraoperative spot images demonstrate changes of left hip replacement. No visible hardware complicating feature. Normal AP alignment. IMPRESSION: Left hip replacement.  No visible complicating feature. Electronically Signed   By: Rolm Baptise M.D.   On: 06/12/2017 18:01   Dg Hip Unilat W Or W/o Pelvis 2-3 Views Left  Result Date: 06/12/2017 CLINICAL DATA:  Postop. EXAM: DG HIP (WITH OR WITHOUT PELVIS) 2-3V LEFT COMPARISON:  None. FINDINGS: The patient has undergone LEFT total hip arthroplasty for LEFT femoral neck fracture. Satisfactory position and alignment. Osteopenia. Surgical drain. IMPRESSION: Satisfactory postoperative appearance. Electronically Signed   By: Staci Righter M.D.   On: 06/12/2017 18:49   Dg Hip Unilat With Pelvis 2-3 Views Left  Result Date: 06/11/2017 CLINICAL DATA:  77 year old female with fall and left hip pain. EXAM: DG HIP (WITH OR WITHOUT PELVIS) 2-3V LEFT COMPARISON:  None. FINDINGS: There is a transverse fracture of the left femoral neck with mild proximal migration of the femoral shaft in relation to the femoral head. There is no dislocation. The bones are osteopenic. Moderate bilateral hip arthritic changes. The soft tissues are grossly unremarkable. IMPRESSION: Fracture of the left femoral neck.  No dislocation. Electronically Signed   By: Anner Crete M.D.   On: 06/11/2017 23:35    Assessment/Plan Christean was seen today for hospitalization  follow-up.  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 3 times a day  Increase Seroquel 50 mg tablets-1-1/2 tablets(75 mg) p.o. twice daily  Continue Haldol 2 mg tablets p.o. every 8 hours as needed agitation, combativeness  DC oxybutynin  Schedule tramadol 50 mg p.o. 3 times daily as patient reports constant pain  Family/ staff Communication:   Total Time:  Documentation:  Face to Face:  Family/Phone:   Labs/tests ordered: CBC, met C, vitamin D, vitamin B12, magnesium  Medication list reviewed and assessed for continued appropriateness. Monthly medication orders reviewed and signed.  Vikki Ports, NP-C Geriatrics [facility] [phone], [place] [address] Cell Phone (Mon-Fri 8am-5pm):  [phone] On Call:  [phone] & follow prompts after  5pm & weekends Office Phone:  [phone] Office Fax:  [phone]

## 2017-06-26 LAB — VITAMIN B12: Vitamin B-12: 764 pg/mL (ref 180–914)

## 2017-06-26 LAB — VITAMIN D 25 HYDROXY (VIT D DEFICIENCY, FRACTURES): Vit D, 25-Hydroxy: 42.9 ng/mL (ref 30.0–100.0)

## 2017-07-01 ENCOUNTER — Encounter
Admission: RE | Admit: 2017-07-01 | Discharge: 2017-07-01 | Disposition: A | Discharge status: Home / Self Care | Payer: Medicare HMO | Source: Ambulatory Visit | Attending: Internal Medicine | Admitting: Internal Medicine

## 2017-07-03 ENCOUNTER — Encounter: Payer: Self-pay | Admitting: Gerontology

## 2017-07-03 ENCOUNTER — Non-Acute Institutional Stay (SKILLED_NURSING_FACILITY): Payer: Medicare HMO | Admitting: Gerontology

## 2017-07-03 ENCOUNTER — Other Ambulatory Visit: Payer: Self-pay

## 2017-07-03 DIAGNOSIS — F0281 Dementia in other diseases classified elsewhere with behavioral disturbance: Secondary | ICD-10-CM

## 2017-07-03 DIAGNOSIS — G301 Alzheimer's disease with late onset: Secondary | ICD-10-CM

## 2017-07-03 DIAGNOSIS — S72002D Fracture of unspecified part of neck of left femur, subsequent encounter for closed fracture with routine healing: Secondary | ICD-10-CM

## 2017-07-03 DIAGNOSIS — T148XXA Other injury of unspecified body region, initial encounter: Secondary | ICD-10-CM | POA: Diagnosis not present

## 2017-07-03 DIAGNOSIS — F02818 Dementia in other diseases classified elsewhere, unspecified severity, with other behavioral disturbance: Secondary | ICD-10-CM

## 2017-07-03 DIAGNOSIS — Z96642 Presence of left artificial hip joint: Secondary | ICD-10-CM

## 2017-07-03 MED ORDER — TRAMADOL HCL 50 MG PO TABS: 50.0000 mg | ORAL_TABLET | ORAL | 0 refills | Status: DC | PRN

## 2017-07-03 NOTE — Telephone Encounter (Signed)
Rx sent to [facility] phone : [phone] , fax : [phone]  

## 2017-07-03 NOTE — Progress Notes (Signed)
Location:   [facility] Room Number: 203B Place of Service:  SNF [phone]) Provider:  Lorenso Quarry, NP-C  Olmedo, Joycie Peek, MD  Patient Care Team: Dione Housekeeper, MD as PCP - General (Family Medicine)  Extended Emergency Contact Information Primary Emergency Contact: Delia Heady Address: [address]          [place], [place] [address] [place] Home Phone: [phone] Mobile Phone: [phone] Relation: Daughter Secondary Emergency Contact: Gabriel Rain[place] Mobile Phone: [phone] Relation: Daughter  Code Status:  DNR Goals of care: Advanced Directive information Advanced Directives 07/03/2017  Does Patient Have a Medical Advance Directive? Yes  Type of Advance Directive Out of facility DNR (pink MOST or yellow form)  Does patient want to make changes to medical advance directive? No - Patient declined  Would patient like information on creating a medical advance directive? -     Chief Complaint  Patient presents with  . Acute Visit    Recheck incision    HPI:  Pt is a 77 y.o. female seen today for an acute visit for evaluation of lef tleg swelling. Pt was admitted to the facility for rehab following hospitalization for mechanical fall with hip fracture and subsequent Left Anterior Total Hip Replacement. Pt is confused d/t Alzheimers. Unable to fully participate in PT/OT. She denies pain. Today, staff noticed a swelling or bulge at the area of the incision. On assessment, the proximal end of the incision has a large, approx. 8 cm bulge underlying the incision. The area is NOT red, hot, not warm to touch. Not painful. It is fluctuant with minor firmness. The incision is well approximated. No sutures/staples/steris in place. No openings. No drainage. No redness. The thigh and calf is soft, supple. Negative Homan's sign. The pt hip has appropriate ROM without pain or crepitus, no popping or skeletal  bulges, etc. This appears to be a hematoma or seroma underlying the incision. Otherwise, pt appears well. Appetite is reported to be good. Voiding well, having regular BMs. Denies chest pain or shortness of breath. VSS. No other complaints.    Please note pt with limited verbal/cognitive ability. Unable to obtain complete ROS. Some ROS info obtained from staff and documentation.   Past Medical History:  Diagnosis Date  . Alzheimer disease   . Anxiety   . Bradycardia   . Chronic kidney disease   . Chronic kidney disease, stage III (moderate) (HCC) 08/03/2014  . Chronic pain   . Closed fracture of distal end of right radius 06/06/2014  . Depression   . Depression   . Diabetes mellitus without complication (HCC)   . Essential hypertension, benign 10/10/2010  . Hypertension   . Low back pain   . Myasthenia gravis without (acute) exacerbation (HCC)   . Obesity   . Organic sleep apnea   . 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 Restless leg syndrome   . Tinnitus   . Type II diabetes mellitus (HCC)    Type II or unspecified type diabetes mellitus without mention of complication, not stated as uncontrolled (CMS-HCC)   Past Surgical History:  Procedure Laterality Date  . ABDOMINAL HYSTERECTOMY    . CERVICAL SPINE SURGERY    . CHOLECYSTECTOMY  12/2012  . LUMBAR SPINE SURGERY    . TOTAL HIP ARTHROPLASTY Left 06/12/2017   Procedure: TOTAL HIP ARTHROPLASTY ANTERIOR  APPROACH;  Surgeon: Kennedy Bucker, MD;  Location: [facility] ORS;  Service: Orthopedics;  Laterality: Left;    Allergies  Allergen Reactions  . Magnesium Shortness Of Breath    Patient has myasthenia. Mg will result in diaphragmatic weakness   . Ativan [Lorazepam] Other (See Comments)    Hallicinations  . Botox [Botulinum Toxin Type A] Other (See Comments)    Reaction: unknown Daughter does not think patient has ever received  . Codeine Hives      Daughter states that the patient does not always react  . Dilaudid [Hydromorphone] Other (See Comments)    Hallicinations  . Magnesium-Containing Compounds Hives  . Onabotulinumtoxina     Other reaction(s): Other (See Comments) Myasthenia gravis patient   . Penicillamine     Other reaction(s): Other (See Comments) Myasthenia gravis  . Penicillins Hives, Rash and Other (See Comments)    Has patient had a PCN reaction causing immediate rash, facial/tongue/throat swelling, SOB or lightheadedness with hypotension: Yes Has patient had a PCN reaction causing severe rash involving mucus membranes or skin necrosis: No Has patient had a PCN reaction that required hospitalization: No Has patient had a PCN reaction occurring within the last 10 years: Unknown If all of the above answers are "NO", then may proceed with Cephalosporin use.  myathenia gravis patient      Allergies as of 07/03/2017      Reactions   Magnesium Shortness Of Breath   Patient has myasthenia. Mg will result in diaphragmatic weakness    Ativan [lorazepam] Other (See Comments)   Hallicinations   Botox [botulinum Toxin Type A] Other (See Comments)   Reaction: unknown Daughter does not think patient has ever received   Codeine Hives   Daughter states that the patient does not always react   Dilaudid [hydromorphone] Other (See Comments)   Hallicinations   Magnesium-containing Compounds Hives   Onabotulinumtoxina    Other reaction(s): Other (See Comments) Myasthenia gravis patient   Penicillamine    Other reaction(s): Other (See Comments) Myasthenia gravis   Penicillins Hives, Rash, Other (See Comments)   Has patient had a PCN reaction causing immediate rash, facial/tongue/throat swelling, SOB or lightheadedness with hypotension: Yes Has patient had a PCN reaction causing severe rash involving mucus membranes or skin necrosis: No Has patient had a PCN reaction that required hospitalization: No Has patient had a  PCN reaction occurring within the last 10 years: Unknown If all of the above answers are "NO", then may proceed with Cephalosporin use. myathenia gravis patient      Medication List        Accurate as of 07/03/17  3:37 PM. Always use your most recent med list.          acetaminophen 650 MG CR tablet Commonly known as:  TYLENOL Take 650 mg by mouth 3 (three) times daily.   bisacodyl 10 MG suppository Commonly known as:  DULCOLAX Place 1 suppository (10 mg total) rectally daily as needed for moderate constipation.   docusate sodium 100 MG capsule Commonly known as:  COLACE Take 1 capsule (100 mg total) by mouth 2 (two) times daily.   ferrous fumarate-b12-vitamic C-folic acid capsule Commonly known as:  TRINSICON / FOLTRIN Take 1 capsule by mouth 2 (two) times daily.   furosemide 20 MG tablet Commonly known as:  LASIX Take 20 mg by mouth.   haloperidol 2 MG tablet Commonly known as:  HALDOL Take 2 mg by mouth every 8 (eight) hours as needed.   liothyronine  25 MCG tablet Commonly known as:  CYTOMEL Take 25 mcg by mouth daily.   Melatonin 5 MG Tabs Take 1 tablet by mouth at bedtime.   multivitamin tablet Take 1 tablet by mouth daily.   mycophenolate 500 MG tablet Commonly known as:  CELLCEPT Take 1,000 mg by mouth 2 (two) times daily.   potassium chloride 10 MEQ tablet Commonly known as:  K-DUR Take 10 mEq by mouth daily.   pyridostigmine 60 MG tablet Commonly known as:  MESTINON Take 1 tablet (60 mg total) by mouth 3 (three) times daily.   QUEtiapine 50 MG tablet Commonly known as:  SEROQUEL Take 75 mg by mouth 2 (two) times daily. 1 1/2 tablets (75 mg)   ranitidine 150 MG capsule Commonly known as:  ZANTAC Take 150 mg by mouth 2 (two) times daily.   sertraline 100 MG tablet Commonly known as:  ZOLOFT Take 100 mg by mouth daily.   traMADol 50 MG tablet Commonly known as:  ULTRAM Take 50 mg by mouth 3 (three) times daily. pain- OK to give prn doses in  addition to scheduled for pain   traMADol 50 MG tablet Commonly known as:  ULTRAM Take 1 tablet (50 mg total) by mouth every 4 (four) hours as needed.   traZODone 100 MG tablet Commonly known as:  DESYREL Take 100 mg by mouth at bedtime.   Vitamin D 2000 units Caps Take 1 capsule by mouth daily.       Review of Systems  Unable to perform ROS: Dementia  Constitutional: Negative for activity change, appetite change, chills, diaphoresis and fever.  HENT: Negative for congestion, mouth sores, nosebleeds, postnasal drip, sneezing, sore throat, trouble swallowing and voice change.   Respiratory: Negative for apnea, cough, choking, chest tightness, shortness of breath and wheezing.   Cardiovascular: Negative for chest pain, palpitations and leg swelling.  Gastrointestinal: Negative for abdominal distention, abdominal pain, constipation, diarrhea and nausea.  Genitourinary: Negative for difficulty urinating, dysuria, frequency and urgency.  Musculoskeletal: Positive for gait problem. Negative for back pain and myalgias. Arthralgias: typical arthritis.  Skin: Positive for wound. Negative for color change, pallor and rash.  Neurological: Positive for weakness. Negative for dizziness, tremors, syncope, speech difficulty, numbness and headaches.  Psychiatric/Behavioral: Positive for agitation (at times), behavioral problems (at times) and confusion.  All other systems reviewed and are negative.   Immunization History  Administered Date(s) Administered  . Influenza,inj,Quad PF,6+ Mos 02/17/2015, 03/14/2016  . Influenza-Unspecified 12/21/2010, 12/01/2013, 03/14/2016  . Pneumococcal Conjugate-13 09/24/2013  . Pneumococcal Polysaccharide-23 02/17/2015   Pertinent  Health Maintenance Due  Topic Date Due  . PNA vac Low Risk Adult (1 of 2 - PCV13) 12/20/2003  . INFLUENZA VACCINE  10/31/2017  . DEXA SCAN  Completed   No flowsheet data found. Functional Status Survey:    Vitals:    07/03/17 1504  BP: (!) 117/55  Pulse: 64  Resp: 18  Temp: (!) 97.5 F (36.4 C)  TempSrc: Oral  SpO2: 95%  Weight: 211 lb 6.4 oz (95.9 kg)  Height: 5\' 10"  (1.778 m)   Body mass index is 30.33 kg/m. Physical Exam  Constitutional: Vital signs are normal. She appears well-developed and well-nourished. She is active and cooperative. She does not appear ill. No distress.  HENT:  Head: Normocephalic and atraumatic.  Mouth/Throat: Uvula is midline, oropharynx is clear and moist and mucous membranes are normal. Mucous membranes are not pale, not dry and not cyanotic.  Eyes: Pupils are equal, round, and reactive  to light. Conjunctivae, EOM and lids are normal.  Neck: Trachea normal, normal range of motion and full passive range of motion without pain. Neck supple. No JVD present. No tracheal deviation, no edema and no erythema present. No thyromegaly present.  Cardiovascular: Normal rate, regular rhythm, normal heart sounds, intact distal pulses and normal pulses. Exam reveals no gallop, no distant heart sounds and no friction rub.  No murmur heard. Pulses:      Dorsalis pedis pulses are 2+ on the right side, and 2+ on the left side.  No edema  Pulmonary/Chest: Effort normal and breath sounds normal. No accessory muscle usage. No respiratory distress. She has no decreased breath sounds. She has no wheezes. She has no rhonchi. She has no rales. She exhibits no tenderness.  Abdominal: Soft. Normal appearance and bowel sounds are normal. She exhibits no distension and no ascites. There is no tenderness.  Musculoskeletal: She exhibits no edema or tenderness.       Left hip: She exhibits decreased range of motion, decreased strength, swelling and laceration.       Legs: Expected osteoarthritis, stiffness; Bilateral Calves soft, supple. Negative Homan's Sign. B- pedal pulses equal  Neurological: She is alert. She has normal strength. A cranial nerve deficit and sensory deficit is present. She exhibits  abnormal muscle tone. Coordination and gait abnormal.  Skin: Skin is warm and dry. Laceration (left hip incisoin) noted. She is not diaphoretic. No cyanosis. No pallor. Nails show no clubbing.  Psychiatric: She has a normal mood and affect. Her speech is normal. Thought content normal. She is agitated (at times) and combative (at times). Cognition and memory are impaired. She expresses impulsivity and inappropriate judgment. She exhibits abnormal recent memory and abnormal remote memory.  Advanced dementia  Nursing note and vitals reviewed.   Labs reviewed: Recent Labs    06/13/17 0418 06/14/17 0944 06/25/17 1640  NA 137 137 141  K 4.3 3.6 3.6  CL 110 106 103  CO2 20* 21* 29  GLUCOSE 160* 131* 131*  BUN 17 14 16   CREATININE 0.84 0.68 0.82  CALCIUM 8.3* 8.7* 8.9  MG  --   --  2.0   Recent Labs    06/12/17 0017 06/25/17 1640  AST 24 21  ALT 16 15  ALKPHOS 83 109  BILITOT 0.7 0.5  PROT 7.0 5.9*  ALBUMIN 3.9 2.9*   Recent Labs    06/13/17 0418 06/14/17 0944 06/25/17 1640  WBC 7.0 9.7 5.9  HGB 11.0* 10.9* 10.5*  HCT 33.0* 33.6* 32.2*  MCV 97.7 96.5 98.9  PLT 110* 136* 276   Lab Results  Component Value Date   TSH 3.514 06/12/2017   Lab Results  Component Value Date   HGBA1C 5.0 06/12/2017   No results found for: CHOL, HDL, LDLCALC, LDLDIRECT, TRIG, CHOLHDL  Significant Diagnostic Results in last 30 days:  Dg Chest 1 View  Result Date: 06/11/2017 CLINICAL DATA:  77 year old female with hip fracture. Preop evaluation. EXAM: CHEST  1 VIEW COMPARISON:  Chest radiograph dated 06/04/2016 FINDINGS: The lungs are clear. There is no pleural effusion or pneumothorax. Mild cardiomegaly. The aorta is tortuous. No acute osseous pathology. IMPRESSION: No active disease. Electronically Signed   By: Elgie Collard M.D.   On: 06/11/2017 23:37   Dg Hip Operative Unilat W Or W/o Pelvis Left  Result Date: 06/12/2017 CLINICAL DATA:  Hip replacement EXAM: OPERATIVE LEFT HIP (WITH  PELVIS IF PERFORMED) 6 VIEWS TECHNIQUE: Fluoroscopic spot image(s) were submitted for interpretation  post-operatively. COMPARISON:  06/11/2017 FINDINGS: Multiple intraoperative spot images demonstrate changes of left hip replacement. No visible hardware complicating feature. Normal AP alignment. IMPRESSION: Left hip replacement.  No visible complicating feature. Electronically Signed   By: Charlett Nose M.D.   On: 06/12/2017 18:01   Dg Hip Unilat W Or W/o Pelvis 2-3 Views Left  Result Date: 06/12/2017 CLINICAL DATA:  Postop. EXAM: DG HIP (WITH OR WITHOUT PELVIS) 2-3V LEFT COMPARISON:  None. FINDINGS: The patient has undergone LEFT total hip arthroplasty for LEFT femoral neck fracture. Satisfactory position and alignment. Osteopenia. Surgical drain. IMPRESSION: Satisfactory postoperative appearance. Electronically Signed   By: Elsie Stain M.D.   On: 06/12/2017 18:49   Dg Hip Unilat With Pelvis 2-3 Views Left  Result Date: 06/11/2017 CLINICAL DATA:  77 year old female with fall and left hip pain. EXAM: DG HIP (WITH OR WITHOUT PELVIS) 2-3V LEFT COMPARISON:  None. FINDINGS: There is a transverse fracture of the left femoral neck with mild proximal migration of the femoral shaft in relation to the femoral head. There is no dislocation. The bones are osteopenic. Moderate bilateral hip arthritic changes. The soft tissues are grossly unremarkable. IMPRESSION: Fracture of the left femoral neck.  No dislocation. Electronically Signed   By: Elgie Collard M.D.   On: 06/11/2017 23:35    Assessment/Plan  Closed fracture of left hip with routine healing, subsequent encounter  Status post total hip replacement, left  Hematoma   Continue PT/OT as appropriate  Continue getting pt out of bed, in the chair daily  Continue to monitor for safety, falls  Continue Tramadol 50 mg po TID scheduled for pain   Continue Tramadol 50 mg po Q 4 hours prn, pain  Continue Tylenol CR 650 mg po TID  Continue  Seroquel 75 mg po BID  Continue Haldol 2 mg po Q 8 hours prn, agitation, combativeness  Alternate ice and heat to the hematoma Q 4 hours  Continue Palliative Care services  Family/ staff Communication:   Total Time:  Documentation:  Face to Face:  Family/Phone:   Labs/tests ordered:    Medication list reviewed and assessed for continued appropriateness.  Brynda Rim, NP-C Geriatrics [facility] [phone], [place] [address] Cell Phone (Mon-Fri 8am-5pm):  [phone] On Call:  [phone] & follow prompts after 5pm & weekends Office Phone:  [phone] Office Fax:  [phone]

## 2017-07-09 DIAGNOSIS — G7000 Myasthenia gravis without (acute) exacerbation: Secondary | ICD-10-CM | POA: Diagnosis not present

## 2017-07-09 DIAGNOSIS — G301 Alzheimer's disease with late onset: Secondary | ICD-10-CM | POA: Diagnosis not present

## 2017-07-09 DIAGNOSIS — F39 Unspecified mood [affective] disorder: Secondary | ICD-10-CM | POA: Diagnosis not present

## 2017-07-09 DIAGNOSIS — M159 Polyosteoarthritis, unspecified: Secondary | ICD-10-CM | POA: Diagnosis not present

## 2017-07-09 DIAGNOSIS — S72012A Unspecified intracapsular fracture of left femur, initial encounter for closed fracture: Secondary | ICD-10-CM | POA: Diagnosis not present

## 2017-07-10 ENCOUNTER — Encounter: Payer: Self-pay | Admitting: Gerontology

## 2017-07-10 NOTE — Progress Notes (Signed)
Opened in error; Disregard.

## 2017-08-01 DIAGNOSIS — G7001 Myasthenia gravis with (acute) exacerbation: Secondary | ICD-10-CM | POA: Diagnosis not present

## 2017-08-01 DIAGNOSIS — F39 Unspecified mood [affective] disorder: Secondary | ICD-10-CM | POA: Diagnosis not present

## 2017-08-01 DIAGNOSIS — S72012A Unspecified intracapsular fracture of left femur, initial encounter for closed fracture: Secondary | ICD-10-CM | POA: Diagnosis not present

## 2017-08-01 DIAGNOSIS — G301 Alzheimer's disease with late onset: Secondary | ICD-10-CM | POA: Diagnosis not present

## 2017-08-08 DIAGNOSIS — L97529 Non-pressure chronic ulcer of other part of left foot with unspecified severity: Secondary | ICD-10-CM | POA: Diagnosis not present

## 2017-08-28 DIAGNOSIS — R05 Cough: Secondary | ICD-10-CM | POA: Diagnosis not present

## 2017-09-18 DIAGNOSIS — G309 Alzheimer's disease, unspecified: Secondary | ICD-10-CM | POA: Diagnosis not present

## 2017-09-18 DIAGNOSIS — M1990 Unspecified osteoarthritis, unspecified site: Secondary | ICD-10-CM | POA: Diagnosis not present

## 2017-09-18 DIAGNOSIS — G7000 Myasthenia gravis without (acute) exacerbation: Secondary | ICD-10-CM | POA: Diagnosis not present

## 2017-09-18 DIAGNOSIS — F39 Unspecified mood [affective] disorder: Secondary | ICD-10-CM | POA: Diagnosis not present

## 2017-09-18 DIAGNOSIS — S7290XA Unspecified fracture of unspecified femur, initial encounter for closed fracture: Secondary | ICD-10-CM | POA: Diagnosis not present

## 2017-09-18 DIAGNOSIS — K219 Gastro-esophageal reflux disease without esophagitis: Secondary | ICD-10-CM | POA: Diagnosis not present

## 2017-09-18 DIAGNOSIS — I890 Lymphedema, not elsewhere classified: Secondary | ICD-10-CM | POA: Diagnosis not present

## 2017-10-02 DIAGNOSIS — H1132 Conjunctival hemorrhage, left eye: Secondary | ICD-10-CM | POA: Diagnosis not present

## 2017-10-09 DIAGNOSIS — M1990 Unspecified osteoarthritis, unspecified site: Secondary | ICD-10-CM | POA: Diagnosis not present

## 2017-10-09 DIAGNOSIS — I890 Lymphedema, not elsewhere classified: Secondary | ICD-10-CM | POA: Diagnosis not present

## 2017-10-09 DIAGNOSIS — K219 Gastro-esophageal reflux disease without esophagitis: Secondary | ICD-10-CM | POA: Diagnosis not present

## 2017-10-09 DIAGNOSIS — F39 Unspecified mood [affective] disorder: Secondary | ICD-10-CM | POA: Diagnosis not present

## 2017-10-09 DIAGNOSIS — G309 Alzheimer's disease, unspecified: Secondary | ICD-10-CM | POA: Diagnosis not present

## 2017-10-09 DIAGNOSIS — G7000 Myasthenia gravis without (acute) exacerbation: Secondary | ICD-10-CM | POA: Diagnosis not present

## 2017-11-11 DIAGNOSIS — I890 Lymphedema, not elsewhere classified: Secondary | ICD-10-CM

## 2017-11-11 DIAGNOSIS — F39 Unspecified mood [affective] disorder: Secondary | ICD-10-CM

## 2017-11-11 DIAGNOSIS — G301 Alzheimer's disease with late onset: Secondary | ICD-10-CM

## 2017-11-11 DIAGNOSIS — G7001 Myasthenia gravis with (acute) exacerbation: Secondary | ICD-10-CM

## 2017-11-11 DIAGNOSIS — M159 Polyosteoarthritis, unspecified: Secondary | ICD-10-CM

## 2018-01-24 DIAGNOSIS — R1030 Lower abdominal pain, unspecified: Secondary | ICD-10-CM | POA: Diagnosis not present

## 2018-01-24 DIAGNOSIS — M25551 Pain in right hip: Secondary | ICD-10-CM

## 2018-01-24 DIAGNOSIS — R0782 Intercostal pain: Secondary | ICD-10-CM

## 2018-02-05 DIAGNOSIS — I890 Lymphedema, not elsewhere classified: Secondary | ICD-10-CM

## 2018-02-05 DIAGNOSIS — G309 Alzheimer's disease, unspecified: Secondary | ICD-10-CM

## 2018-02-05 DIAGNOSIS — M1990 Unspecified osteoarthritis, unspecified site: Secondary | ICD-10-CM

## 2018-02-05 DIAGNOSIS — G7000 Myasthenia gravis without (acute) exacerbation: Secondary | ICD-10-CM

## 2018-02-05 DIAGNOSIS — K219 Gastro-esophageal reflux disease without esophagitis: Secondary | ICD-10-CM

## 2018-02-05 DIAGNOSIS — R21 Rash and other nonspecific skin eruption: Secondary | ICD-10-CM

## 2018-02-05 DIAGNOSIS — F39 Unspecified mood [affective] disorder: Secondary | ICD-10-CM | POA: Diagnosis not present

## 2018-03-06 IMAGING — CT CT HEAD W/O CM
3 series · 15 of 47 positions shown, 18 images · non-contrast
Comparison: None.

CLINICAL DATA: Head trauma.

EXAM:
CT HEAD WITHOUT CONTRAST
CT CERVICAL SPINE WITHOUT CONTRAST
TECHNIQUE: Multidetector CT imaging of the head and cervical spine was
performed following the standard protocol without intravenous
contrast. Multiplanar CT image reconstructions of the cervical spine
were also generated.

[Series 3: head wo · axial · 0.40mm/px · z∈[+400,+530]mm · 9 of 32 slices shown, 12 images]
[im 3/32  brain]
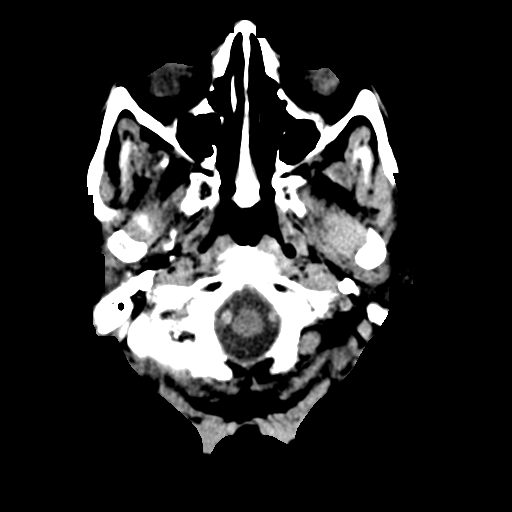
[im 3/32  bone]
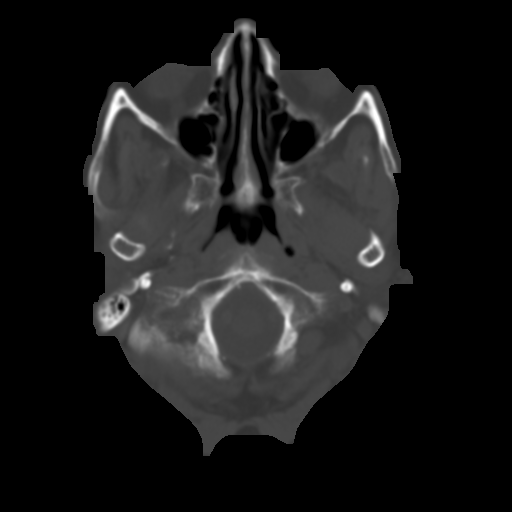
[im 6/32  brain]
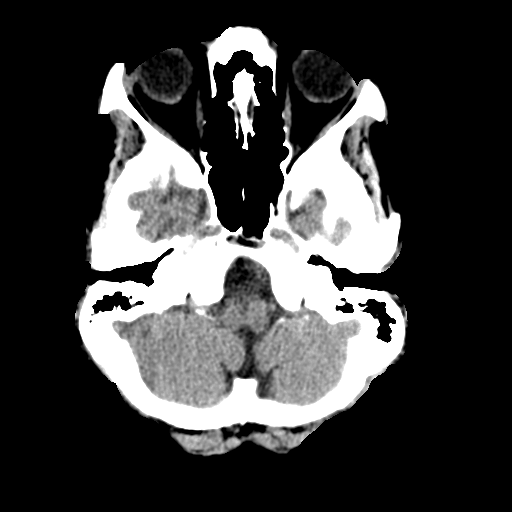
[im 9/32  brain]
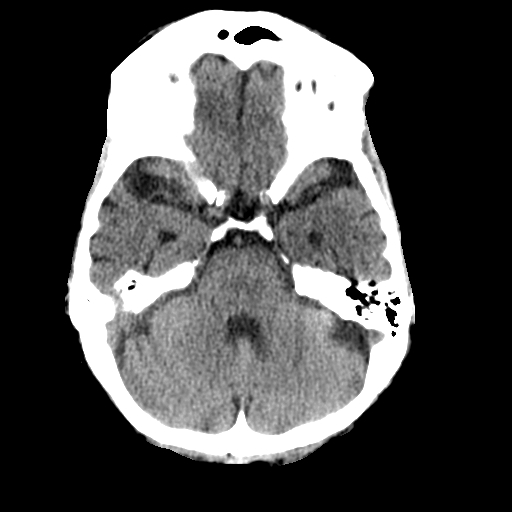
[im 12/32  brain]
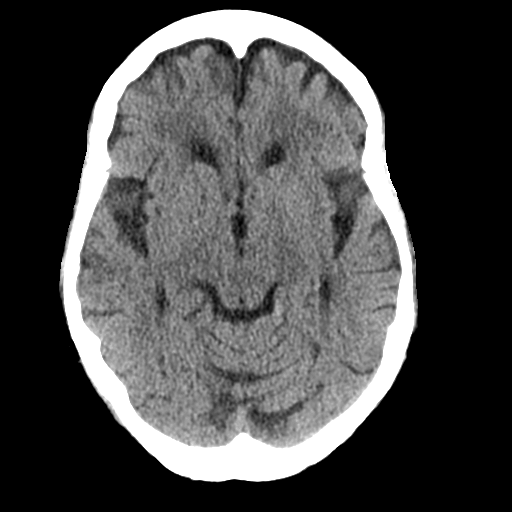
[im 17/32  brain]
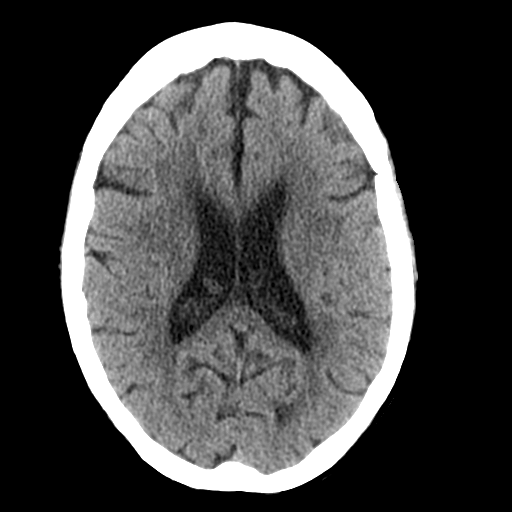
[im 17/32  bone]
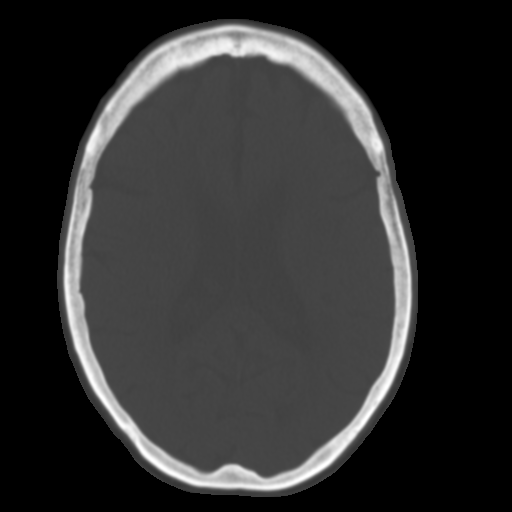
[im 20/32  brain]
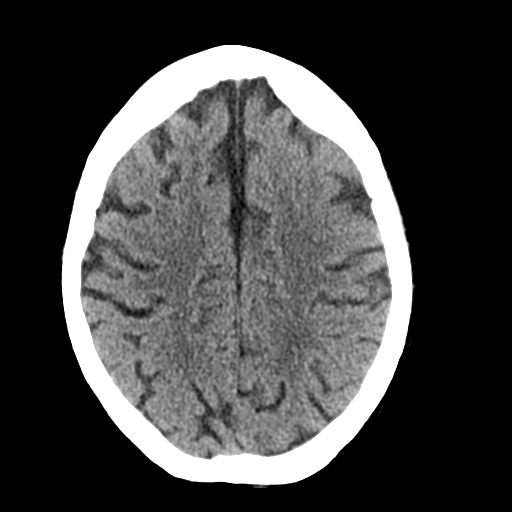
[im 23/32  brain]
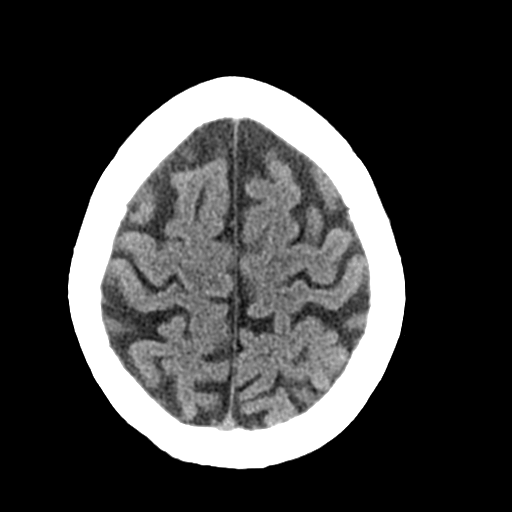
[im 26/32  brain]
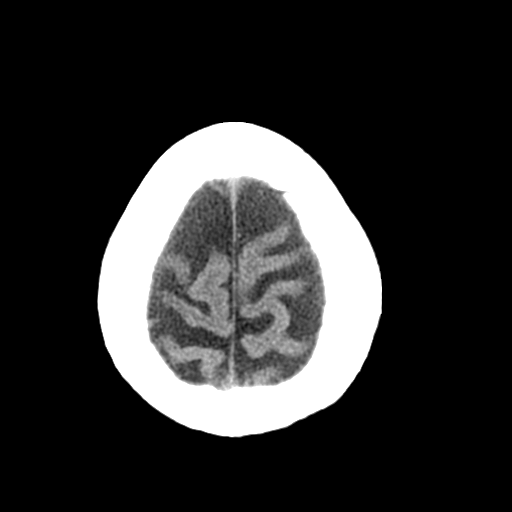
[im 29/32  brain]
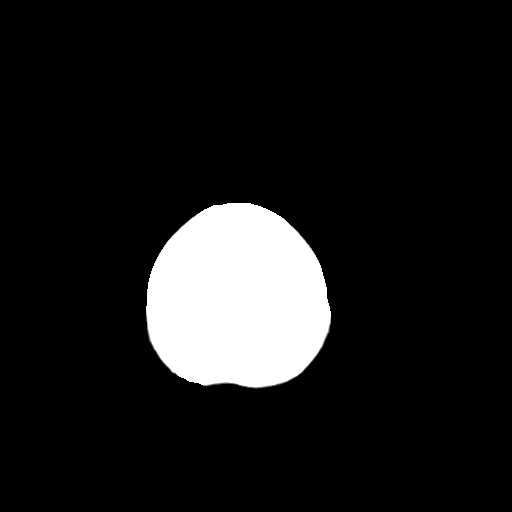
[im 29/32  bone]
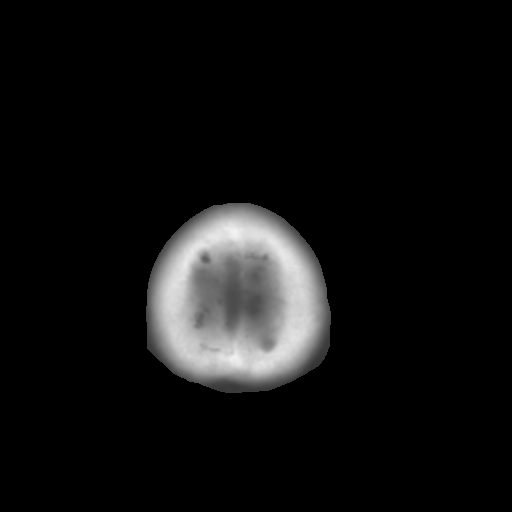

[Series 4: coronal soft tissue · coronal · 0.29mm/px · 3 of 67 slices shown]
[im 23/67  brain]
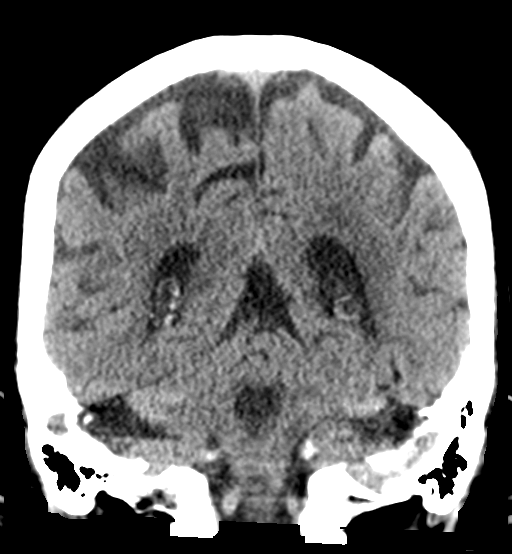
[im 30/67  brain]
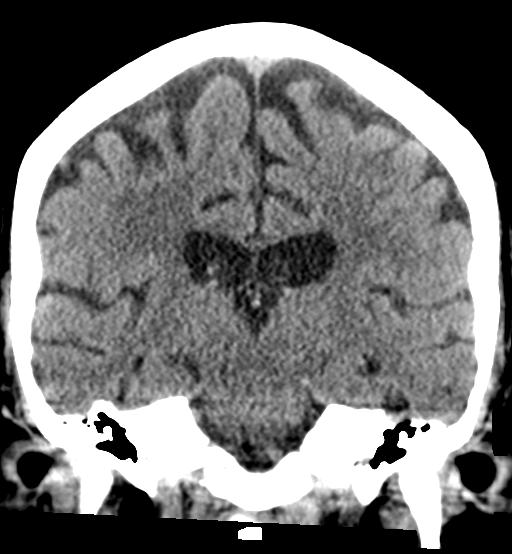
[im 37/67  brain]
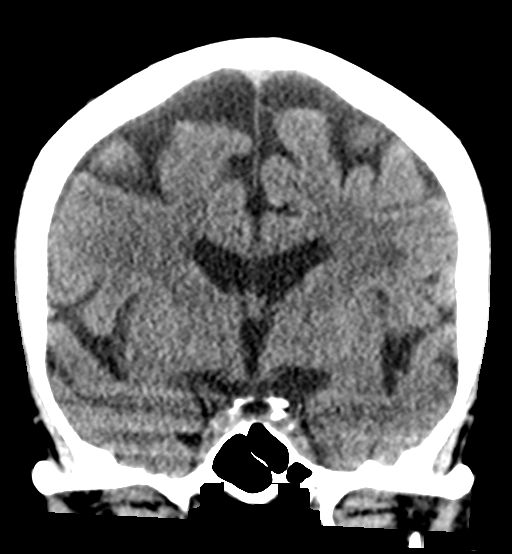

[Series 5: sagittal soft tissue · sagittal · 0.31mm/px · 3 of 50 slices shown]
[im 17/50  brain]
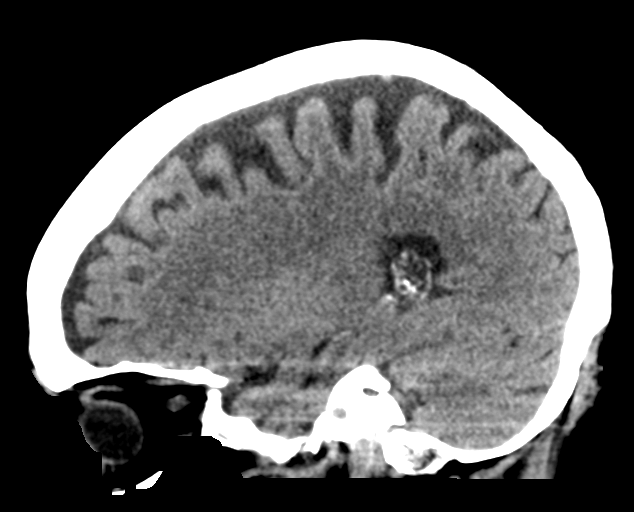
[im 25/50  brain]
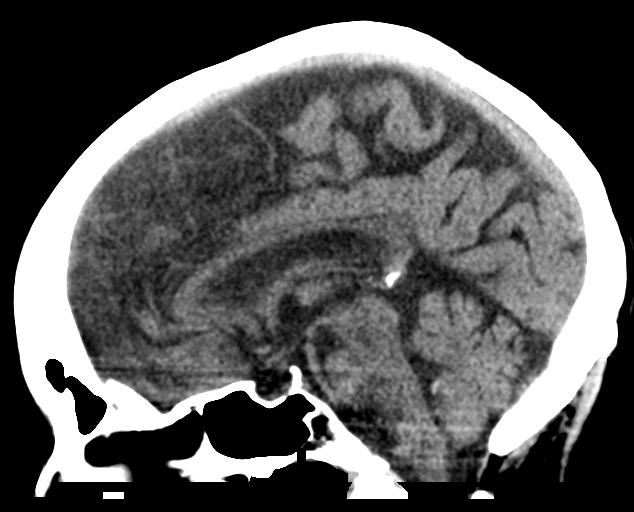
[im 33/50  brain]
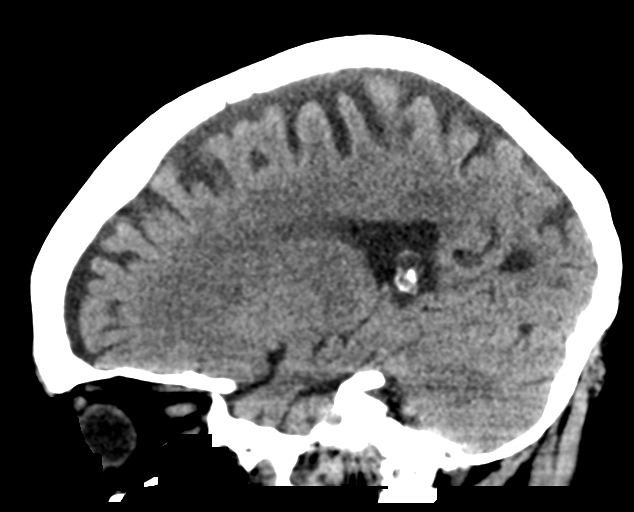

[15 of 47 positions shown; findings below may reference images not displayed]

FINDINGS: CT HEAD FINDINGS

Brain: There is atrophy and chronic small vessel disease changes. No
acute intracranial abnormality. Specifically, no hemorrhage,
hydrocephalus, mass lesion, acute infarction, or significant
intracranial injury.

Vascular: No hyperdense vessel or unexpected calcification.

Skull: No acute calvarial abnormality.

Sinuses/Orbits: Visualized paranasal sinuses and mastoids clear.
Orbital soft tissues unremarkable.

Other: None

CT CERVICAL SPINE FINDINGS

Alignment: Normal

Skull base and vertebrae: No fracture

Soft tissues and spinal canal: Prevertebral soft tissues are normal.
No epidural or paraspinal hematoma.

Disc levels: Degenerative disc disease with disc space narrowing and
spurring at C5-6 and C6-7. Diffuse bilateral degenerative facet
disease.

Upper chest: No acute findings

Other: Carotid artery calcifications bilaterally.
IMPRESSION: No acute intracranial abnormality.

Atrophy, chronic microvascular disease.

Degenerative disc and facet disease in the cervical spine. No acute
findings.

## 2018-03-06 IMAGING — CR DG HAND 2V*R*
1 series · 2 of 2 positions shown · non-contrast
Comparison: 05/26/2017 wrist series

CLINICAL DATA: Fall

EXAM:
RIGHT HAND - 2 VIEW

[Series 1: dg hand 2 view right · 0.14mm/px · 2 of 2 slices shown]
[im 1/2]
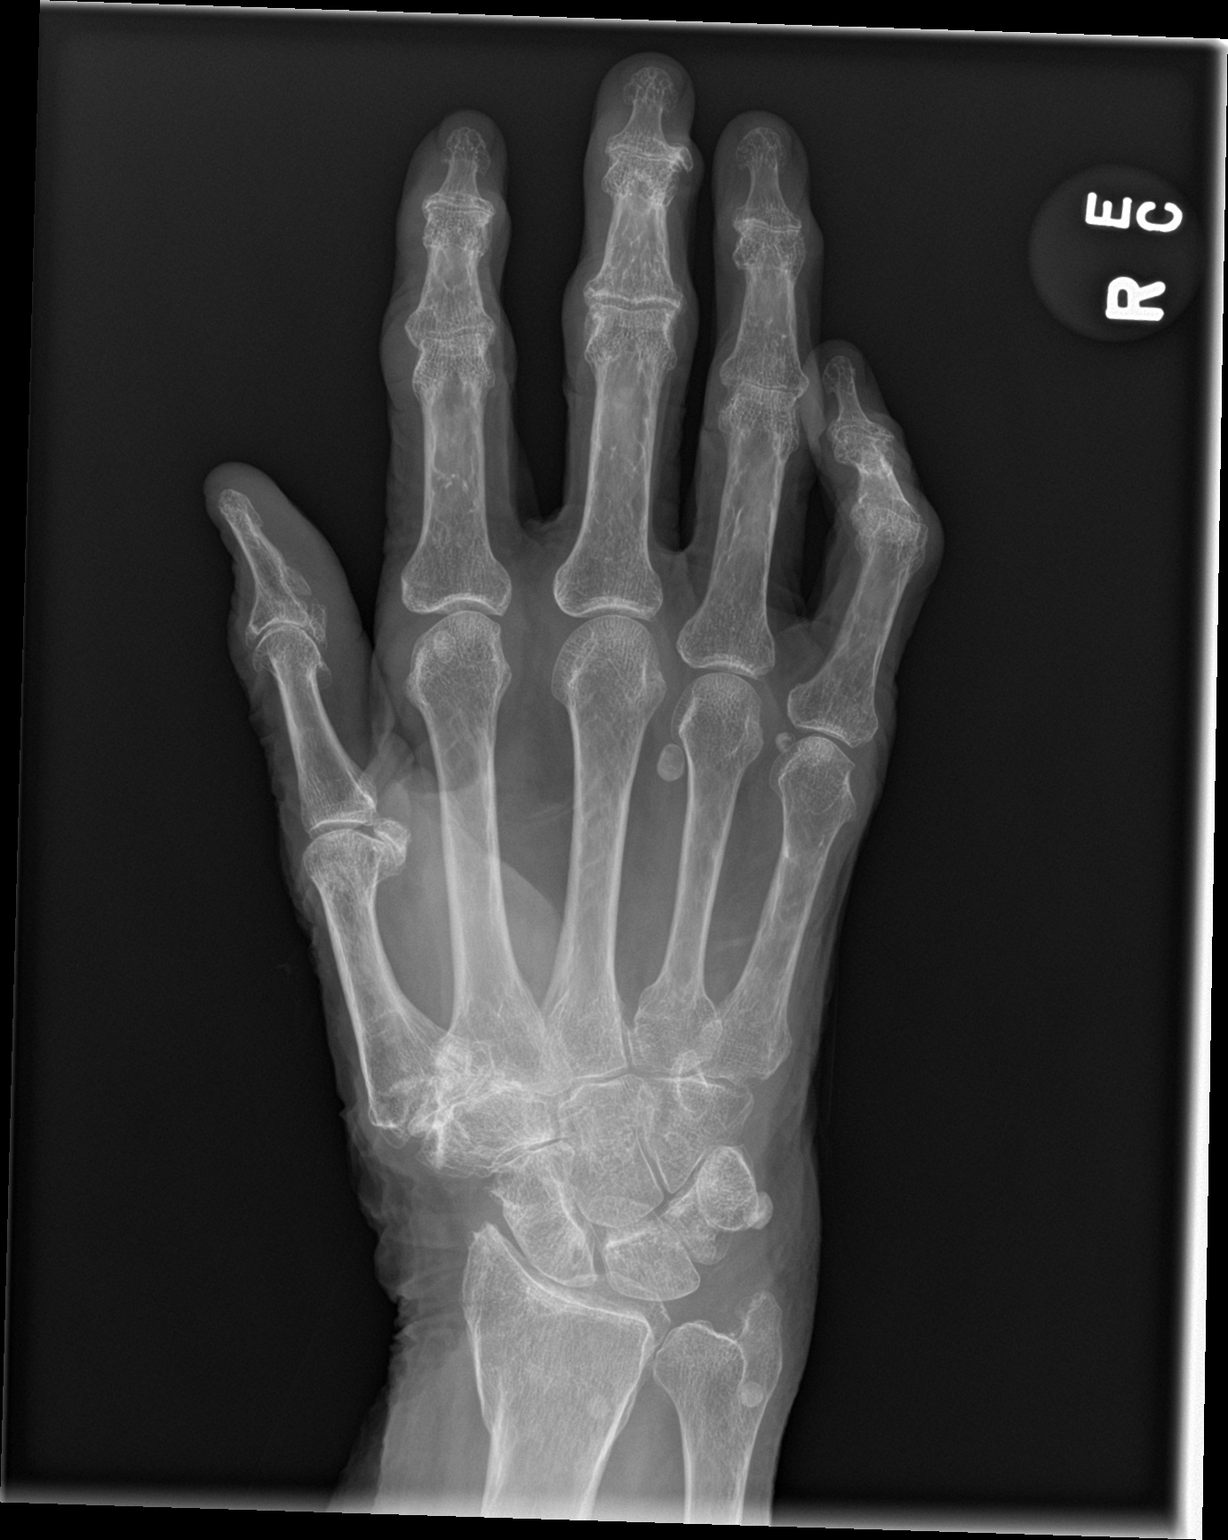
[im 2/2]
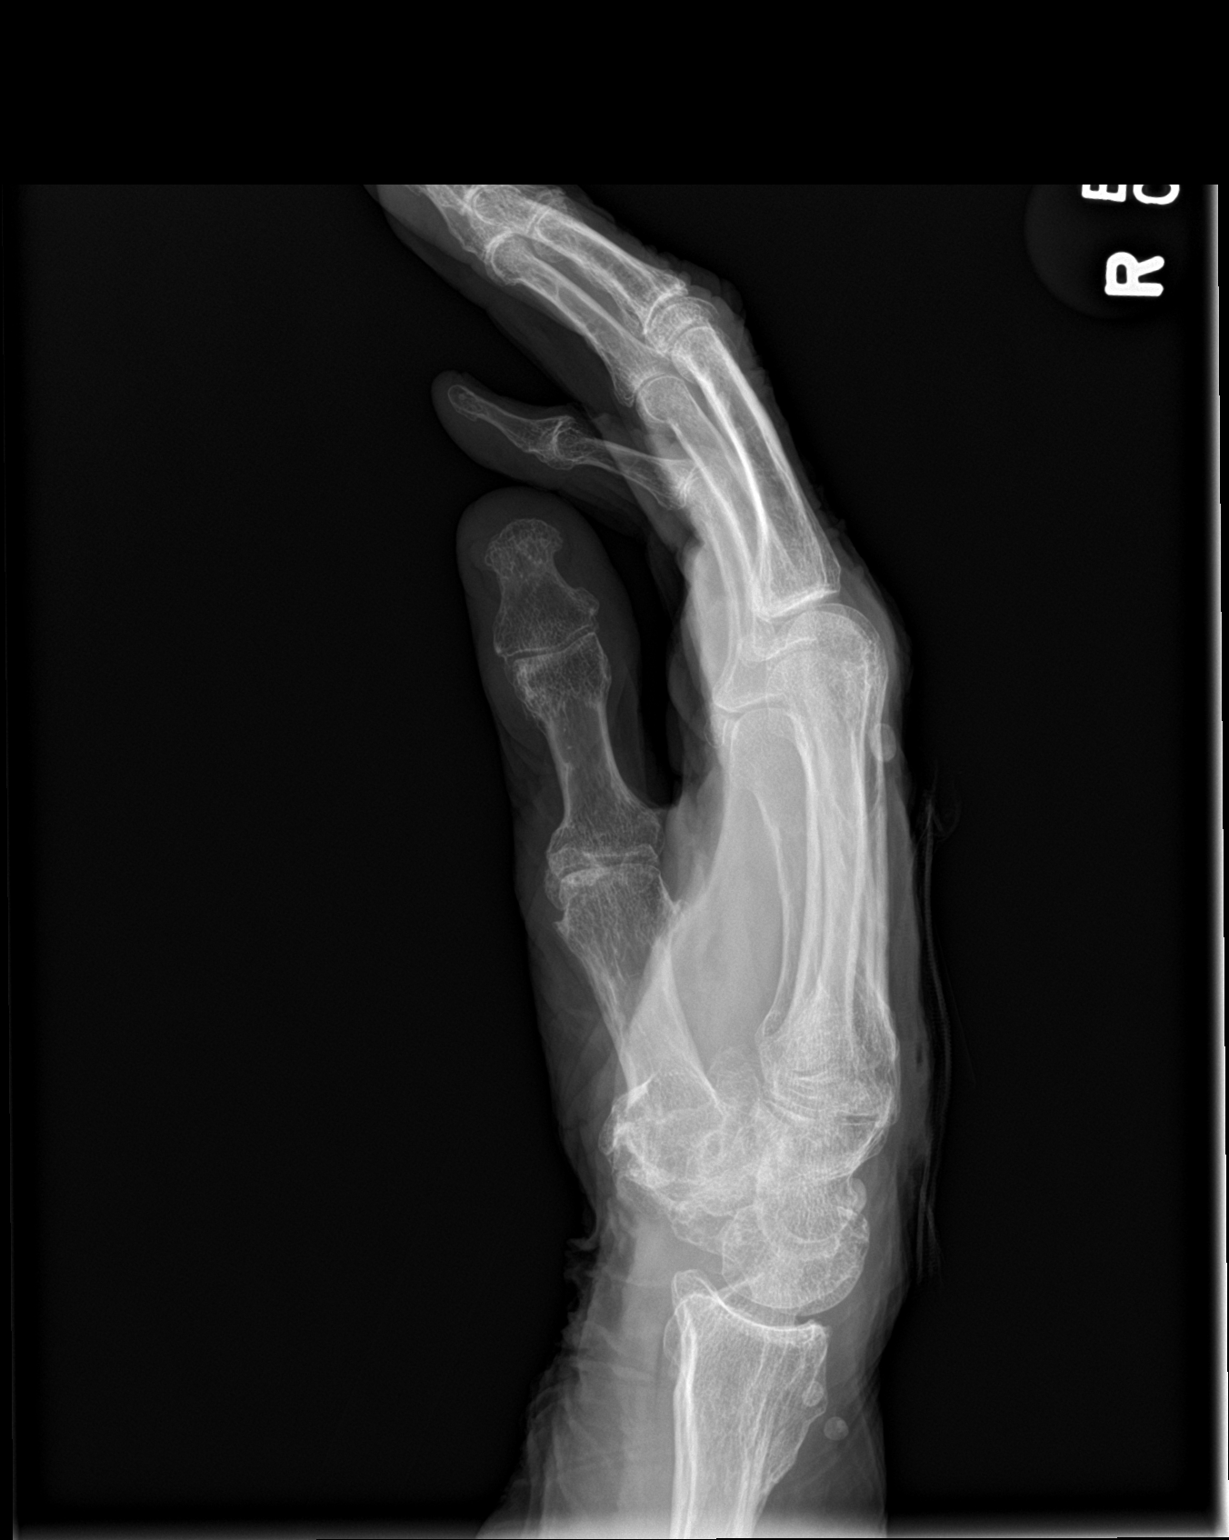

[2 of 2 positions shown; findings below may reference images not displayed]

FINDINGS: Advanced osteoarthritic changes at the 1st carpometacarpal joint.
Moderate degenerative changes throughout the wrist and IP joints. No
acute fracture, subluxation or dislocation.
IMPRESSION: No acute bony abnormality.

## 2018-03-06 IMAGING — CT CT L SPINE W/O CM
5 of 8 series · 15 of 33 positions shown, 16 images · non-contrast
Comparison: MRI of the lumbar spine February 04, 2013

CLINICAL DATA: Dresser fell on patient. Injury. History of dementia
and discitis osteomyelitis.

EXAM:
CT THORACIC AND LUMBAR SPINE WITHOUT CONTRAST
TECHNIQUE: Multidetector CT imaging of the thoracic and lumbar spine was
performed without contrast. Multiplanar CT image reconstructions
were also generated.

[Series 5: l spine soft · axial · 0.42mm/px · z∈[-126,-66]mm · 2 of 92 slices shown (1 of 2)]
[im 31/92  soft-tissue]
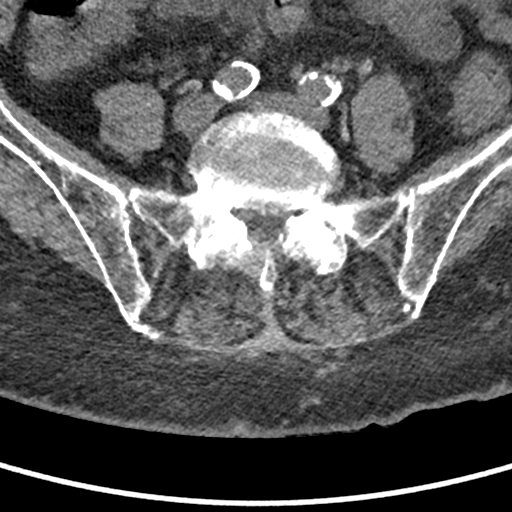
[im 61/92  soft-tissue]
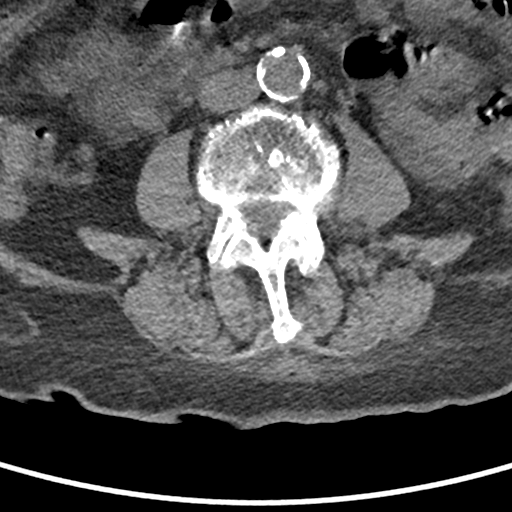

[Series 6: sagittal bone · sagittal · 0.33mm/px · 5 of 93 slices shown]
[im 16/93  bone]
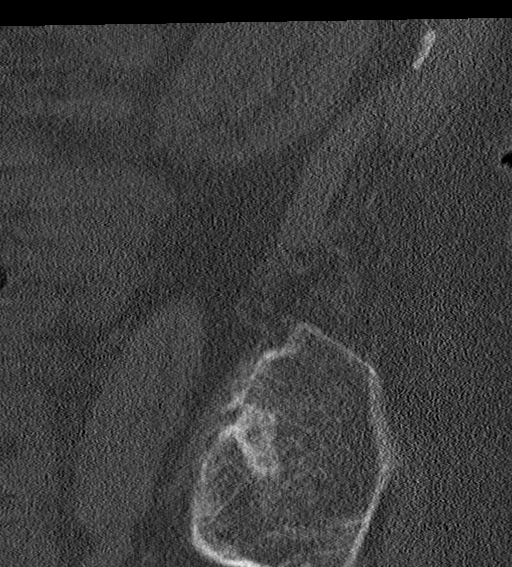
[im 31/93  bone]
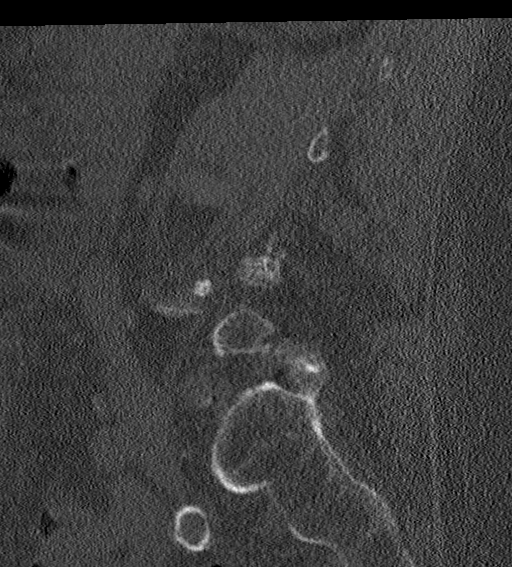
[im 47/93  bone]
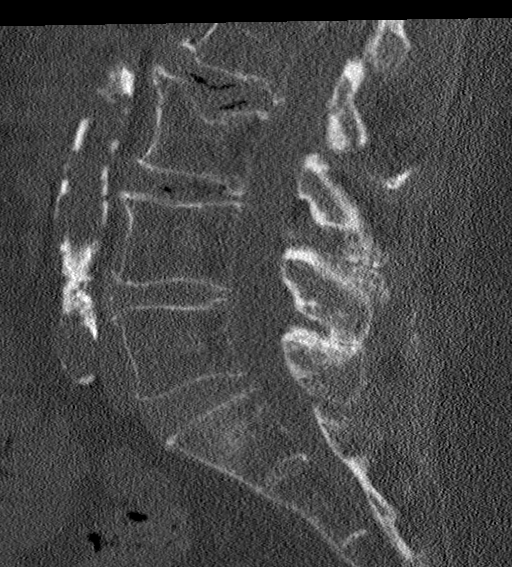
[im 62/93  bone]
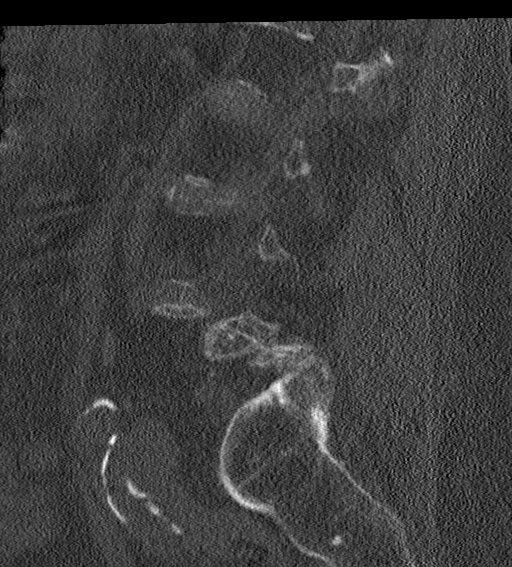
[im 77/93  bone]
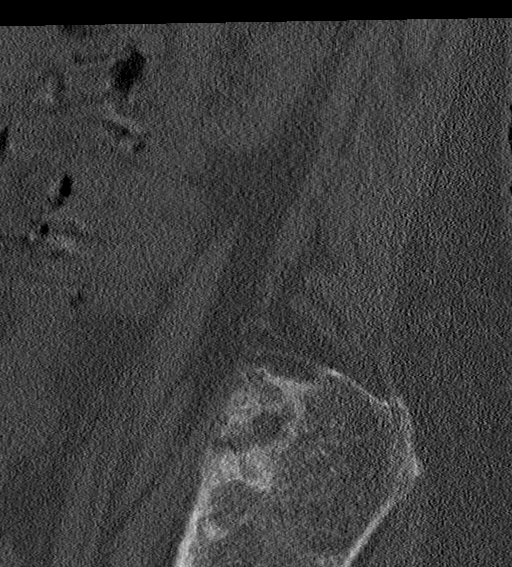

[Series 7: coronal bone · coronal · 0.36mm/px · 3 of 84 slices shown]
[im 21/84  bone]
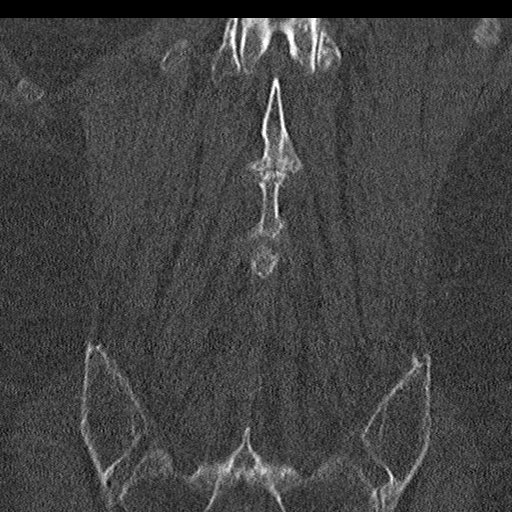
[im 42/84  bone]
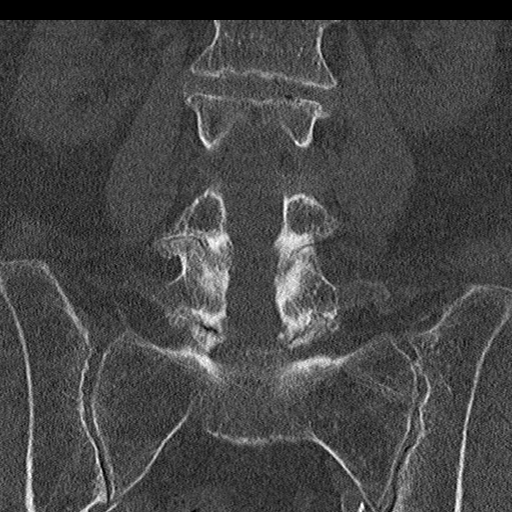
[im 63/84  bone]
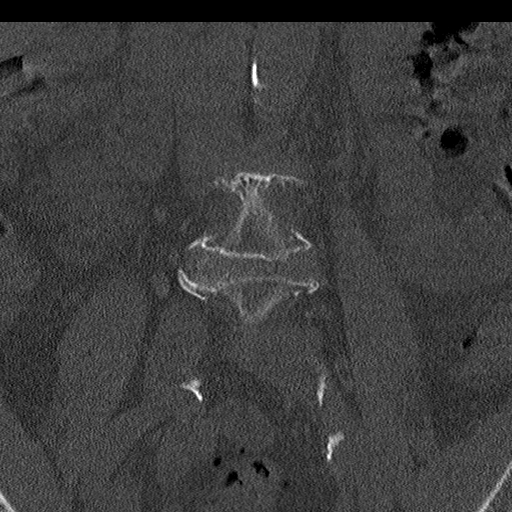

[Series 11: l spine soft · axial · 0.37mm/px · z∈[-26,+34]mm · 2 of 92 slices shown (2 of 2)]
[im 31/92  soft-tissue]
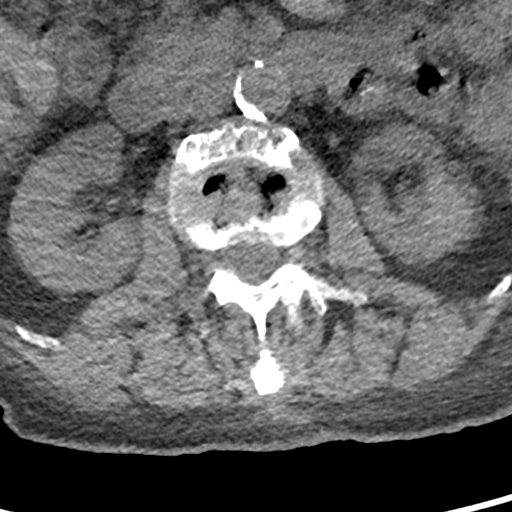
[im 61/92  soft-tissue]
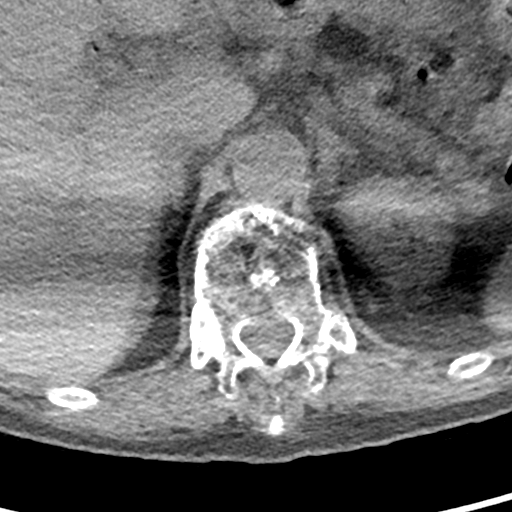

[Series 16: multi disc · axial · 0.21mm/px · z∈[-170,+7]mm · 3 of 79 slices shown, 4 images]
[im 1/79  soft-tissue]
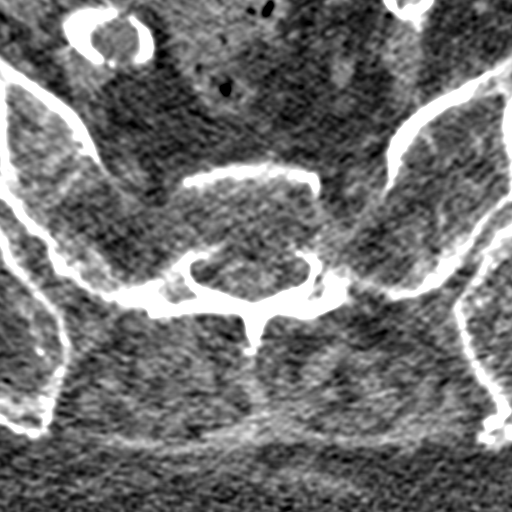
[im 1/79  bone]
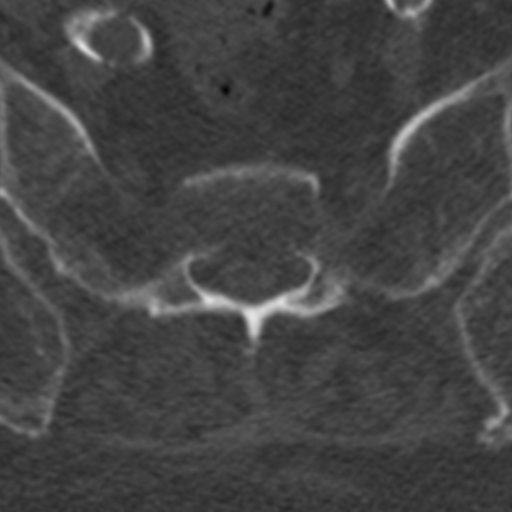
[im 40/79  bone]
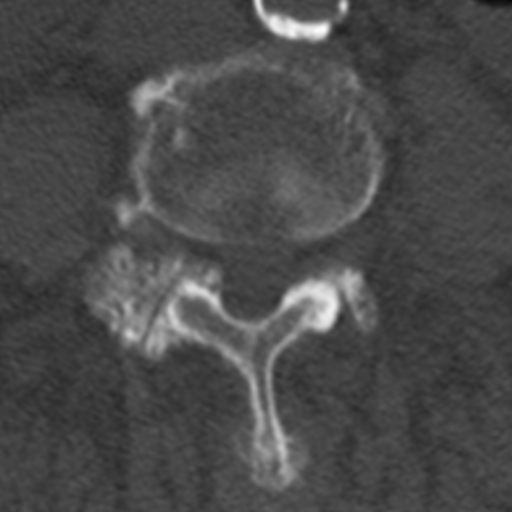
[im 79/79  bone]
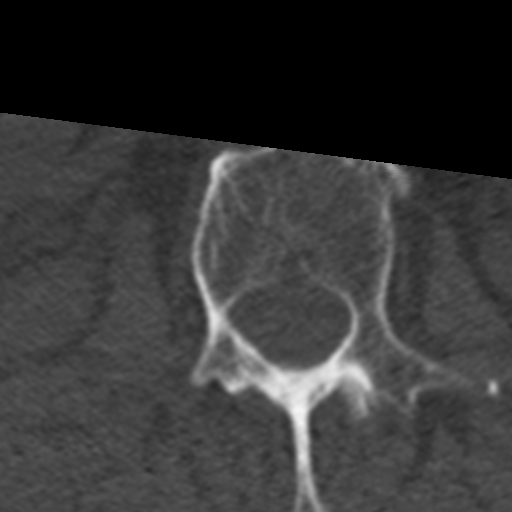

[15 of 33 positions shown; findings below may reference images not displayed]

FINDINGS: CT THORACIC SPINE FINDINGS

ALIGNMENT: Maintained thoracic lordosis. No malalignment.

VERTEBRAE: Sequelae of old T12-L1 discitis osteomyelitis with
progressed height loss from prior MRI, interbody fusion. Remaining
disc heights generally preserved. Bridging ventral osteophytes.
Osteopenia. No destructive bony lesions. Scattered old Schmorl's
nodes.

PARASPINAL AND OTHER SOFT TISSUES: Nonacute. Heterogeneous thyroid
without dominant nodule. Cardiomegaly. Calcific atherosclerosis
aortic arch.

DISC LEVELS:

No osseous canal stenosis.  Mild T8-9 neural foraminal narrowing.

CT LUMBAR SPINE FINDINGS

SEGMENTATION: For the purposes of this report the last well-formed
intervertebral disc space is reported as L5-S1.

ALIGNMENT: Maintained lumbar lordosis. Mild kyphosis T12-L1. Minimal
grade 1 L5-S1 anterolisthesis without spondylolysis.

VERTEBRAE: T12-L1 auto interbody arthrodesis, sequelae of old
osteomyelitis. Mild new though nonacute L3 compression fracture with
superior endplate Schmorl's node. Additional old Schmorl's nodes. No
acute fracture. Mild L4-5 disc height loss unchanged. Multilevel
vacuum disc and moderate ventral endplate spurring. Osteopenia
without destructive bony lesions.

PARASPINAL AND OTHER SOFT TISSUES: Nonacute. Small air-filled hiatal
hernia. Pneumobilia, status post cholecystectomy. Severe calcific
atherosclerosis infrarenal aorta with chronic dissection. Colonic
diverticulosis incompletely imaged.

DISC LEVELS:

T12-L1: Moderate broad-based disc osteophyte complex, mild canal
stenosis. Moderate to severe RIGHT, moderate LEFT osseous neural
foraminal narrowing.

L1-2: Small broad-based disc osteophyte complex, no canal stenosis.
Mild neural foraminal narrowing.

L2-3: Small broad-based disc osteophyte complex, mild facet
arthropathy and ligamentum flavum redundancy. No canal stenosis.
Mild bilateral neural foraminal narrowing.

L3-4: Large disc osteophyte complex asymmetric to the LEFT. Mild to
moderate facet arthropathy and ligamentum flavum redundancy. Mild
canal stenosis. Moderate RIGHT, moderate to severe LEFT neural
foraminal narrowing.

L4-5: Moderate broad-based disc osteophyte complex. Moderate facet
arthropathy and ligamentum flavum redundancy. Mild canal stenosis.
Severe RIGHT, moderate to severe LEFT neural foraminal narrowing.

L5-S1: Anterolisthesis. Severe facet arthropathy with ligamentum
flavum redundancy. Small broad-based disc osteophyte complex. No
canal stenosis. Moderate to severe bilateral neural foraminal
narrowing.
IMPRESSION: CT THORACIC SPINE IMPRESSION

1. No acute fracture or malalignment.
2. Old, healed T12-L1 discitis osteomyelitis with interbody fusion
and mild kyphosis.
3. Osteopenia.

CT LUMBAR SPINE IMPRESSION

1. No acute fracture. Stable grade 1 L5-S1 anterolisthesis without
spondylolysis. Old mild T3 superior endplate compression fracture.
2. Degenerative change of the lumbar spine resulting in mild canal
stenosis L3-4 and L4-5.
3. Neural foraminal narrowing L2-3 through L5-S1: Severe on the
RIGHT at L4-5.

Aortic Atherosclerosis ([id number]).

## 2018-04-15 DIAGNOSIS — G7000 Myasthenia gravis without (acute) exacerbation: Secondary | ICD-10-CM

## 2018-04-15 DIAGNOSIS — G301 Alzheimer's disease with late onset: Secondary | ICD-10-CM

## 2018-04-15 DIAGNOSIS — F39 Unspecified mood [affective] disorder: Secondary | ICD-10-CM

## 2018-04-15 DIAGNOSIS — I890 Lymphedema, not elsewhere classified: Secondary | ICD-10-CM

## 2018-04-15 DIAGNOSIS — K219 Gastro-esophageal reflux disease without esophagitis: Secondary | ICD-10-CM

## 2018-06-04 DIAGNOSIS — K219 Gastro-esophageal reflux disease without esophagitis: Secondary | ICD-10-CM

## 2018-06-04 DIAGNOSIS — B351 Tinea unguium: Secondary | ICD-10-CM

## 2018-06-04 DIAGNOSIS — M1990 Unspecified osteoarthritis, unspecified site: Secondary | ICD-10-CM

## 2018-06-04 DIAGNOSIS — F39 Unspecified mood [affective] disorder: Secondary | ICD-10-CM | POA: Diagnosis not present

## 2018-06-04 DIAGNOSIS — G7001 Myasthenia gravis with (acute) exacerbation: Secondary | ICD-10-CM

## 2018-06-04 DIAGNOSIS — G309 Alzheimer's disease, unspecified: Secondary | ICD-10-CM | POA: Diagnosis not present

## 2018-06-04 DIAGNOSIS — G479 Sleep disorder, unspecified: Secondary | ICD-10-CM

## 2018-06-04 DIAGNOSIS — I890 Lymphedema, not elsewhere classified: Secondary | ICD-10-CM

## 2018-08-07 DIAGNOSIS — I890 Lymphedema, not elsewhere classified: Secondary | ICD-10-CM

## 2018-08-07 DIAGNOSIS — K219 Gastro-esophageal reflux disease without esophagitis: Secondary | ICD-10-CM

## 2018-08-07 DIAGNOSIS — G7001 Myasthenia gravis with (acute) exacerbation: Secondary | ICD-10-CM

## 2018-08-07 DIAGNOSIS — G301 Alzheimer's disease with late onset: Secondary | ICD-10-CM | POA: Diagnosis not present

## 2018-08-07 DIAGNOSIS — F39 Unspecified mood [affective] disorder: Secondary | ICD-10-CM | POA: Diagnosis not present

## 2018-09-08 DIAGNOSIS — L821 Other seborrheic keratosis: Secondary | ICD-10-CM | POA: Diagnosis not present

## 2018-10-08 DIAGNOSIS — M1990 Unspecified osteoarthritis, unspecified site: Secondary | ICD-10-CM | POA: Diagnosis not present

## 2018-10-08 DIAGNOSIS — G309 Alzheimer's disease, unspecified: Secondary | ICD-10-CM | POA: Diagnosis not present

## 2018-10-08 DIAGNOSIS — I890 Lymphedema, not elsewhere classified: Secondary | ICD-10-CM

## 2018-10-08 DIAGNOSIS — F39 Unspecified mood [affective] disorder: Secondary | ICD-10-CM

## 2018-10-08 DIAGNOSIS — G479 Sleep disorder, unspecified: Secondary | ICD-10-CM

## 2018-10-08 DIAGNOSIS — K219 Gastro-esophageal reflux disease without esophagitis: Secondary | ICD-10-CM | POA: Diagnosis not present

## 2018-10-08 DIAGNOSIS — G7000 Myasthenia gravis without (acute) exacerbation: Secondary | ICD-10-CM

## 2018-10-17 DIAGNOSIS — R21 Rash and other nonspecific skin eruption: Secondary | ICD-10-CM | POA: Diagnosis not present

## 2018-10-27 DIAGNOSIS — L989 Disorder of the skin and subcutaneous tissue, unspecified: Secondary | ICD-10-CM

## 2018-12-04 DIAGNOSIS — G7000 Myasthenia gravis without (acute) exacerbation: Secondary | ICD-10-CM | POA: Diagnosis not present

## 2018-12-04 DIAGNOSIS — F39 Unspecified mood [affective] disorder: Secondary | ICD-10-CM

## 2018-12-04 DIAGNOSIS — K219 Gastro-esophageal reflux disease without esophagitis: Secondary | ICD-10-CM

## 2018-12-04 DIAGNOSIS — G301 Alzheimer's disease with late onset: Secondary | ICD-10-CM

## 2019-02-04 DIAGNOSIS — F39 Unspecified mood [affective] disorder: Secondary | ICD-10-CM

## 2019-02-04 DIAGNOSIS — G7000 Myasthenia gravis without (acute) exacerbation: Secondary | ICD-10-CM

## 2019-02-04 DIAGNOSIS — K219 Gastro-esophageal reflux disease without esophagitis: Secondary | ICD-10-CM

## 2019-02-04 DIAGNOSIS — M1990 Unspecified osteoarthritis, unspecified site: Secondary | ICD-10-CM

## 2019-02-04 DIAGNOSIS — G309 Alzheimer's disease, unspecified: Secondary | ICD-10-CM

## 2019-02-04 DIAGNOSIS — I890 Lymphedema, not elsewhere classified: Secondary | ICD-10-CM

## 2019-02-05 ENCOUNTER — Inpatient Hospital Stay (HOSPITAL_COMMUNITY)
Admission: AD | Admit: 2019-02-05 | Discharge: 2019-03-03 | DRG: 177 | Disposition: E | Payer: Medicare HMO | Source: Other Acute Inpatient Hospital | Attending: Internal Medicine | Admitting: Internal Medicine

## 2019-02-05 ENCOUNTER — Encounter: Payer: Self-pay | Admitting: Emergency Medicine

## 2019-02-05 ENCOUNTER — Emergency Department: Payer: Medicare HMO

## 2019-02-05 ENCOUNTER — Emergency Department
Admission: EM | Admit: 2019-02-05 | Discharge: 2019-02-05 | Disposition: A | Discharge status: Other Acute Inpatient Hospital | Payer: Medicare HMO | Attending: Emergency Medicine | Admitting: Emergency Medicine

## 2019-02-05 ENCOUNTER — Other Ambulatory Visit: Payer: Self-pay

## 2019-02-05 DIAGNOSIS — I10 Essential (primary) hypertension: Secondary | ICD-10-CM | POA: Diagnosis present

## 2019-02-05 DIAGNOSIS — G2581 Restless legs syndrome: Secondary | ICD-10-CM | POA: Diagnosis present

## 2019-02-05 DIAGNOSIS — A0839 Other viral enteritis: Secondary | ICD-10-CM | POA: Diagnosis present

## 2019-02-05 DIAGNOSIS — Z79890 Hormone replacement therapy (postmenopausal): Secondary | ICD-10-CM

## 2019-02-05 DIAGNOSIS — Z880 Allergy status to penicillin: Secondary | ICD-10-CM

## 2019-02-05 DIAGNOSIS — G9341 Metabolic encephalopathy: Secondary | ICD-10-CM | POA: Diagnosis present

## 2019-02-05 DIAGNOSIS — Z79899 Other long term (current) drug therapy: Secondary | ICD-10-CM | POA: Diagnosis not present

## 2019-02-05 DIAGNOSIS — Z888 Allergy status to other drugs, medicaments and biological substances status: Secondary | ICD-10-CM

## 2019-02-05 DIAGNOSIS — N183 Chronic kidney disease, stage 3 unspecified: Secondary | ICD-10-CM | POA: Diagnosis present

## 2019-02-05 DIAGNOSIS — F329 Major depressive disorder, single episode, unspecified: Secondary | ICD-10-CM | POA: Diagnosis present

## 2019-02-05 DIAGNOSIS — E1122 Type 2 diabetes mellitus with diabetic chronic kidney disease: Secondary | ICD-10-CM | POA: Diagnosis present

## 2019-02-05 DIAGNOSIS — Z515 Encounter for palliative care: Secondary | ICD-10-CM | POA: Diagnosis not present

## 2019-02-05 DIAGNOSIS — J9601 Acute respiratory failure with hypoxia: Secondary | ICD-10-CM | POA: Diagnosis present

## 2019-02-05 DIAGNOSIS — U071 COVID-19: Secondary | ICD-10-CM | POA: Diagnosis present

## 2019-02-05 DIAGNOSIS — J9600 Acute respiratory failure, unspecified whether with hypoxia or hypercapnia: Secondary | ICD-10-CM | POA: Diagnosis present

## 2019-02-05 DIAGNOSIS — Z66 Do not resuscitate: Secondary | ICD-10-CM | POA: Diagnosis present

## 2019-02-05 DIAGNOSIS — I129 Hypertensive chronic kidney disease with stage 1 through stage 4 chronic kidney disease, or unspecified chronic kidney disease: Secondary | ICD-10-CM | POA: Insufficient documentation

## 2019-02-05 DIAGNOSIS — F0280 Dementia in other diseases classified elsewhere without behavioral disturbance: Secondary | ICD-10-CM | POA: Diagnosis present

## 2019-02-05 DIAGNOSIS — J1289 Other viral pneumonia: Secondary | ICD-10-CM | POA: Diagnosis present

## 2019-02-05 DIAGNOSIS — Z885 Allergy status to narcotic agent status: Secondary | ICD-10-CM

## 2019-02-05 DIAGNOSIS — G7000 Myasthenia gravis without (acute) exacerbation: Secondary | ICD-10-CM | POA: Diagnosis present

## 2019-02-05 DIAGNOSIS — J1282 Pneumonia due to coronavirus disease 2019: Secondary | ICD-10-CM | POA: Diagnosis present

## 2019-02-05 DIAGNOSIS — Z820 Family history of epilepsy and other diseases of the nervous system: Secondary | ICD-10-CM

## 2019-02-05 DIAGNOSIS — E119 Type 2 diabetes mellitus without complications: Secondary | ICD-10-CM

## 2019-02-05 DIAGNOSIS — N189 Chronic kidney disease, unspecified: Secondary | ICD-10-CM | POA: Diagnosis present

## 2019-02-05 DIAGNOSIS — E870 Hyperosmolality and hypernatremia: Secondary | ICD-10-CM | POA: Diagnosis present

## 2019-02-05 DIAGNOSIS — Z833 Family history of diabetes mellitus: Secondary | ICD-10-CM

## 2019-02-05 DIAGNOSIS — F0281 Dementia in other diseases classified elsewhere with behavioral disturbance: Secondary | ICD-10-CM | POA: Diagnosis not present

## 2019-02-05 DIAGNOSIS — G301 Alzheimer's disease with late onset: Secondary | ICD-10-CM | POA: Diagnosis present

## 2019-02-05 DIAGNOSIS — F419 Anxiety disorder, unspecified: Secondary | ICD-10-CM | POA: Diagnosis present

## 2019-02-05 DIAGNOSIS — E860 Dehydration: Secondary | ICD-10-CM | POA: Diagnosis present

## 2019-02-05 DIAGNOSIS — Z8249 Family history of ischemic heart disease and other diseases of the circulatory system: Secondary | ICD-10-CM

## 2019-02-05 DIAGNOSIS — G4739 Other sleep apnea: Secondary | ICD-10-CM | POA: Diagnosis present

## 2019-02-05 DIAGNOSIS — Z993 Dependence on wheelchair: Secondary | ICD-10-CM

## 2019-02-05 DIAGNOSIS — I2699 Other pulmonary embolism without acute cor pulmonale: Secondary | ICD-10-CM | POA: Diagnosis present

## 2019-02-05 LAB — CBC WITH DIFFERENTIAL/PLATELET
Abs Immature Granulocytes: 0.09 10*3/uL — ABNORMAL HIGH (ref 0.00–0.07)
Basophils Absolute: 0 10*3/uL (ref 0.0–0.1)
Basophils Relative: 0 %
Eosinophils Absolute: 0 10*3/uL (ref 0.0–0.5)
Eosinophils Relative: 0 %
HCT: 42.7 % (ref 36.0–46.0)
Hemoglobin: 14 g/dL (ref 12.0–15.0)
Immature Granulocytes: 1 %
Lymphocytes Relative: 5 %
Lymphs Abs: 0.5 10*3/uL — ABNORMAL LOW (ref 0.7–4.0)
MCH: 30.6 pg (ref 26.0–34.0)
MCHC: 32.8 g/dL (ref 30.0–36.0)
MCV: 93.4 fL (ref 80.0–100.0)
Monocytes Absolute: 0.5 10*3/uL (ref 0.1–1.0)
Monocytes Relative: 5 %
Neutro Abs: 9.3 10*3/uL — ABNORMAL HIGH (ref 1.7–7.7)
Neutrophils Relative %: 89 %
Platelets: 210 10*3/uL (ref 150–400)
RBC: 4.57 MIL/uL (ref 3.87–5.11)
RDW: 12.3 % (ref 11.5–15.5)
WBC: 10.5 10*3/uL (ref 4.0–10.5)
nRBC: 0 % (ref 0.0–0.2)

## 2019-02-05 LAB — C-REACTIVE PROTEIN: CRP: 13.8 mg/dL — ABNORMAL HIGH (ref <1.0)

## 2019-02-05 LAB — PROTIME-INR
INR: 1.1 (ref 0.8–1.2)
Prothrombin Time: 14.3 seconds (ref 11.4–15.2)

## 2019-02-05 LAB — COMPREHENSIVE METABOLIC PANEL
ALT: 14 U/L (ref 0–44)
AST: 30 U/L (ref 15–41)
Albumin: 3 g/dL — ABNORMAL LOW (ref 3.5–5.0)
Alkaline Phosphatase: 76 U/L (ref 38–126)
Anion gap: 14 (ref 5–15)
BUN: 18 mg/dL (ref 8–23)
CO2: 22 mmol/L (ref 22–32)
Calcium: 9.1 mg/dL (ref 8.9–10.3)
Chloride: 107 mmol/L (ref 98–111)
Creatinine, Ser: 0.77 mg/dL (ref 0.44–1.00)
GFR calc Af Amer: >60 mL/min (ref >60)
GFR calc non Af Amer: >60 mL/min (ref >60)
Glucose, Bld: 141 mg/dL — ABNORMAL HIGH (ref 70–99)
Potassium: 3.2 mmol/L — ABNORMAL LOW (ref 3.5–5.1)
Sodium: 143 mmol/L (ref 135–145)
Total Bilirubin: 0.8 mg/dL (ref 0.3–1.2)
Total Protein: 6.6 g/dL (ref 6.5–8.1)

## 2019-02-05 LAB — LACTATE DEHYDROGENASE: LDH: 250 U/L — ABNORMAL HIGH (ref 98–192)

## 2019-02-05 LAB — FIBRINOGEN: Fibrinogen: 626 mg/dL — ABNORMAL HIGH (ref 210–475)

## 2019-02-05 LAB — PROCALCITONIN: Procalcitonin: <0.1 ng/mL

## 2019-02-05 LAB — LACTIC ACID, PLASMA
Lactic Acid, Venous: 2.5 mmol/L (ref 0.5–1.9)
Lactic Acid, Venous: 1.6 mmol/L (ref 0.5–1.9)

## 2019-02-05 LAB — TRIGLYCERIDES: Triglycerides: 95 mg/dL (ref <150)

## 2019-02-05 LAB — APTT: aPTT: 26 seconds (ref 24–36)

## 2019-02-05 LAB — FERRITIN: Ferritin: 754 ng/mL — ABNORMAL HIGH (ref 11–307)

## 2019-02-05 LAB — FIBRIN DERIVATIVES D-DIMER (ARMC ONLY): Fibrin derivatives D-dimer (ARMC): >7500 ng/mL (FEU) — ABNORMAL HIGH (ref 0.00–499.00)

## 2019-02-05 MED ORDER — SODIUM CHLORIDE 0.9 % IV SOLN
100.0000 mg | Freq: Every day | INTRAVENOUS | Status: DC
  Filled 2019-02-05: qty 20

## 2019-02-05 MED ORDER — PYRIDOSTIGMINE BROMIDE 60 MG PO TABS
60.0000 mg | ORAL_TABLET | Freq: Three times a day (TID) | ORAL | Status: DC
  Administered 2019-02-05: 17:00:00 60 mg via ORAL
  Filled 2019-02-05 (×3): qty 1

## 2019-02-05 MED ORDER — DEXAMETHASONE SODIUM PHOSPHATE 10 MG/ML IJ SOLN
6.0000 mg | INTRAMUSCULAR | Status: DC
  Administered 2019-02-06: 6 mg via INTRAVENOUS
  Filled 2019-02-05: qty 1

## 2019-02-05 MED ORDER — ENOXAPARIN SODIUM 60 MG/0.6ML ~~LOC~~ SOLN: 0.5000 mg/kg | Freq: Two times a day (BID) | SUBCUTANEOUS | Status: DC

## 2019-02-05 MED ORDER — SODIUM CHLORIDE 0.9% FLUSH
3.0000 mL | Freq: Two times a day (BID) | INTRAVENOUS | Status: DC
  Administered 2019-02-06 (×2): 3 mL via INTRAVENOUS

## 2019-02-05 MED ORDER — DEXAMETHASONE SODIUM PHOSPHATE 10 MG/ML IJ SOLN
8.0000 mg | Freq: Once | INTRAMUSCULAR | Status: AC
  Administered 2019-02-05: 8 mg via INTRAVENOUS
  Filled 2019-02-05: qty 1

## 2019-02-05 MED ORDER — HEPARIN BOLUS VIA INFUSION
4000.0000 [IU] | Freq: Once | INTRAVENOUS | Status: AC
  Administered 2019-02-05: 18:00:00 4000 [IU] via INTRAVENOUS
  Filled 2019-02-05: qty 4000

## 2019-02-05 MED ORDER — QUETIAPINE FUMARATE 25 MG PO TABS
50.0000 mg | ORAL_TABLET | Freq: Once | ORAL | Status: AC
  Administered 2019-02-05: 20:00:00 50 mg via ORAL
  Filled 2019-02-05: qty 2

## 2019-02-05 MED ORDER — SODIUM CHLORIDE 0.9 % IV SOLN: 250.0000 mL | INTRAVENOUS | Status: DC | PRN

## 2019-02-05 MED ORDER — HEPARIN (PORCINE) 25000 UT/250ML-% IV SOLN
1300.0000 [IU]/h | INTRAVENOUS | Status: DC
  Administered 2019-02-05: 18:00:00 1300 [IU]/h via INTRAVENOUS
  Filled 2019-02-05: qty 250

## 2019-02-05 MED ORDER — PYRIDOSTIGMINE BROMIDE 60 MG PO TABS: 60.0000 mg | ORAL_TABLET | Freq: Once | ORAL | Status: DC

## 2019-02-05 MED ORDER — INSULIN ASPART 100 UNIT/ML ~~LOC~~ SOLN
0.0000 [IU] | Freq: Three times a day (TID) | SUBCUTANEOUS | Status: DC
  Administered 2019-02-06: 13:00:00 2 [IU] via SUBCUTANEOUS

## 2019-02-05 MED ORDER — SODIUM CHLORIDE 0.9 % IV SOLN
Freq: Once | INTRAVENOUS | Status: AC
  Administered 2019-02-05: 18:00:00 via INTRAVENOUS

## 2019-02-05 MED ORDER — POTASSIUM CHLORIDE 10 MEQ/100ML IV SOLN
10.0000 meq | INTRAVENOUS | Status: AC
  Administered 2019-02-06 (×3): 10 meq via INTRAVENOUS
  Filled 2019-02-05: qty 100

## 2019-02-05 MED ORDER — SODIUM CHLORIDE 0.9 % IV SOLN
200.0000 mg | Freq: Once | INTRAVENOUS | Status: AC
  Administered 2019-02-05: 18:00:00 200 mg via INTRAVENOUS
  Filled 2019-02-05: qty 40

## 2019-02-05 MED ORDER — GUAIFENESIN-DM 100-10 MG/5ML PO SYRP
10.0000 mL | ORAL_SOLUTION | ORAL | Status: DC | PRN
  Administered 2019-02-06: 10 mL via ORAL
  Filled 2019-02-05: qty 10

## 2019-02-05 MED ORDER — HEPARIN BOLUS VIA INFUSION
4500.0000 [IU] | Freq: Once | INTRAVENOUS | Status: DC
  Filled 2019-02-05: qty 4500

## 2019-02-05 MED ORDER — SODIUM CHLORIDE 0.9% FLUSH: 3.0000 mL | INTRAVENOUS | Status: DC | PRN

## 2019-02-05 MED ORDER — SODIUM CHLORIDE 0.9 % IV SOLN
100.0000 mg | Freq: Every day | INTRAVENOUS | Status: DC
  Administered 2019-02-06: 100 mg via INTRAVENOUS
  Filled 2019-02-05 (×2): qty 20

## 2019-02-05 MED ORDER — LORAZEPAM 2 MG/ML IJ SOLN
1.0000 mg | Freq: Once | INTRAMUSCULAR | Status: AC
  Administered 2019-02-05: 1 mg via INTRAVENOUS
  Filled 2019-02-05: qty 1

## 2019-02-05 MED ORDER — ONDANSETRON HCL 4 MG/2ML IJ SOLN: 4.0000 mg | Freq: Four times a day (QID) | INTRAMUSCULAR | Status: DC | PRN

## 2019-02-05 MED ORDER — HALOPERIDOL LACTATE 5 MG/ML IJ SOLN
2.0000 mg | Freq: Once | INTRAMUSCULAR | Status: AC
  Administered 2019-02-05: 18:00:00 2 mg via INTRAVENOUS
  Filled 2019-02-05: qty 1

## 2019-02-05 MED ORDER — ZINC SULFATE 220 (50 ZN) MG PO CAPS
220.0000 mg | ORAL_CAPSULE | Freq: Every day | ORAL | Status: DC
  Administered 2019-02-06: 10:00:00 220 mg via ORAL
  Filled 2019-02-05: qty 1

## 2019-02-05 MED ORDER — ONDANSETRON HCL 4 MG PO TABS: 4.0000 mg | ORAL_TABLET | Freq: Four times a day (QID) | ORAL | Status: DC | PRN

## 2019-02-05 MED ORDER — ACETAMINOPHEN 325 MG PO TABS: 650.0000 mg | ORAL_TABLET | Freq: Four times a day (QID) | ORAL | Status: DC | PRN

## 2019-02-05 MED ORDER — VITAMIN C 500 MG PO TABS
500.0000 mg | ORAL_TABLET | Freq: Every day | ORAL | Status: DC
  Administered 2019-02-06: 10:00:00 500 mg via ORAL
  Filled 2019-02-05: qty 1

## 2019-02-05 NOTE — Consult Note (Signed)
ANTICOAGULATION CONSULT NOTE - Initial Consult  Pharmacy Consult for Heparin Indication: pulmonary embolus in setting of COVID-19  Allergies  Allergen Reactions  . Magnesium Shortness Of Breath    Patient has myasthenia. Mg will result in diaphragmatic weakness   . Ativan [Lorazepam] Other (See Comments)    Hallicinations  . Botox [Botulinum Toxin Type A] Other (See Comments)    Reaction: unknown Daughter does not think patient has ever received  . Codeine Hives    Daughter states that the patient does not always react  . Dilaudid [Hydromorphone] Other (See Comments)    Hallicinations  . Magnesium-Containing Compounds Hives  . Onabotulinumtoxina     Other reaction(s): Other (See Comments) Myasthenia gravis patient   . Penicillamine     Other reaction(s): Other (See Comments) Myasthenia gravis  . Penicillins Hives, Rash and Other (See Comments)    Has patient had a PCN reaction causing immediate rash, facial/tongue/throat swelling, SOB or lightheadedness with hypotension: Yes Has patient had a PCN reaction causing severe rash involving mucus membranes or skin necrosis: No Has patient had a PCN reaction that required hospitalization: No Has patient had a PCN reaction occurring within the last 10 years: Unknown If all of the above answers are "NO", then may proceed with Cephalosporin use.  myathenia gravis patient      Patient Measurements: Height: 5\' 8"  (172.7 cm) Weight: 194 lb (88 kg) IBW/kg (Calculated) : 63.9 Heparin Dosing Weight: 82.3 kg  Vital Signs: Temp: 98 F (36.7 C) (11/05 0916) Temp Source: Axillary (11/05 0916) BP: 136/58 (11/05 1630) Pulse Rate: 73 (11/05 1630)  Labs: Recent Labs    06-Feb-2019 0940  HGB 14.0  HCT 42.7  PLT 210  CREATININE 0.77    Estimated Creatinine Clearance: 65.1 mL/min (by C-G formula based on SCr of 0.77 mg/dL).   Medical History: Past Medical History:  Diagnosis Date  . Alzheimer disease ([place])   . Anxiety   .  Bradycardia   . Chronic kidney disease   . Chronic kidney disease, stage III (moderate) 08/03/2014  . Chronic pain   . Closed fracture of distal end of right radius 06/06/2014  . Depression   . Depression   . Diabetes mellitus without complication ([place])   . Essential hypertension, benign 10/10/2010  . Hypertension   . Low back pain   . Myasthenia gravis without (acute) exacerbation ([place])   . Obesity   . Organic sleep apnea   . 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 Restless leg syndrome   . Tinnitus   . Type II diabetes mellitus (HCC)    Type II or unspecified type diabetes mellitus without mention of complication, not stated as uncontrolled (CMS-HCC)    Medications:  No anticoagulation prior to admission per chart  Assessment: Patient is an 78 y/o F who presented to [facility] ED from nursing home with respiratory distress in setting of positive COVID test. D-dimer elevated >7500 ng/mL. Patient is normotensive. Pharmacy has been consulted for heparin drip for PE. H&H, Plt WNL. Baseline coags pending.   Goal of Therapy:  Heparin level 0.3-0.7 units/ml Monitor platelets by anticoagulation protocol: Yes   Plan:  -Heparin bolus 4000 units IV x 1 followed by continuous infusion at 1300 units/hr  -HL 8 hours after start of infusion -Daily CBC per protocol  [place] Resident 2019/02/06,4:46 PM

## 2019-02-05 NOTE — ED Notes (Signed)
Lab to bedside

## 2019-02-05 NOTE — ED Notes (Signed)
Pt unable to sign for transfer due to disorientation.  Family updated on transfer per this RN.

## 2019-02-05 NOTE — ED Notes (Signed)
Per lab, repeat blue and green top have hemolyzed.  Lab notified to send someone to collect necessary specimens.

## 2019-02-05 NOTE — ED Notes (Signed)
EMTALA reviewed by this RN.  

## 2019-02-05 NOTE — ED Triage Notes (Signed)
Patient from [facility] via [facility]. Patient tested COVID+ at [facility]. Per EMS, patient has had worsening SOB and increased oxygen demand for the past 2-3 days. At baseline, patient is on room air. Patient oxygen saturation 70% on 4L upon EMS arrival to patient. Patient arrives to ED on 15L non -rebreather with oxygen saturation 94%. Patient able to nod appropriately to questions but becomes increasingly SOB with talking.

## 2019-02-05 NOTE — ED Notes (Signed)
Lab notified to add on aPTT, PT-INR.

## 2019-02-05 NOTE — ED Notes (Signed)
Mitt[place] placed on patient to prevent patient from pulling off NRB mask.

## 2019-02-05 NOTE — ED Notes (Signed)
Patient taken off of non-rebreather and placed on 6L [place]. Oxygen remains between 90-94%.

## 2019-02-05 NOTE — ED Notes (Signed)
Pharmacy notified to send Mestinon dose.

## 2019-02-05 NOTE — ED Notes (Signed)
While checking on patient, patient begins to panic, stating, "I just want to die, I just want to go home.  Why cant I go home."  Pt becomes very restless, saturation drops down to 71% on 6L nasal cannula, some cyanosis noted to lips.  EDP, Isaacs called to bedside.    Pt repositioned to sit upright.  Pt placed on NRB mask in addition to nasal cannula per EDP, Isaacs request.  This RN remained with patient until she calmed down.

## 2019-02-05 NOTE — ED Notes (Signed)
Patient continues to remove nasal cannula from face and O2 sat dropping. Non-rebreather mask placed back on patient at 10L. Patient leaving mask in place at this time.

## 2019-02-05 NOTE — Consult Note (Signed)
Remdesivir - Pharmacy Brief Note   O:  ALT: 14 CXR: Impression: patchy airspace in the lungs bilaterally c/w multifocal PNA SpO2: hypoxic on room air and 6L [place] now requiring non-rebreather   A/P:  Remdesivir 200 mg IVPB once followed by 100 mg IVPB daily x 4 days.   [place] Resident 27-Feb-2019 4:28 PM

## 2019-02-05 NOTE — ED Notes (Signed)
Pt has removed NRB mask, placed back on 6L nasal cannula per this RN.

## 2019-02-05 NOTE — H&P (Signed)
History and Physical    Glenda Stephens JQZ:[id number] DOB: 12-15-1940 DOA: 2019/02/15  PCP: Karie Schwalbe, MD  Patient coming from: snf  Chief Complaint:  sob  HPI: Glenda Stephens is a 78 y.o. female with medical history significant of dementia, htn, dm, ckd , myasthenia gravis comes in with worsening sob from snf.  Pt cannot provide any history due to dementia.  On NRB, drops in 70s without mask.  Agitated.     Review of Systems:  Unobtainable due to dementia   Past Medical History:  Diagnosis Date  . Alzheimer disease (HCC)   . Anxiety   . Bradycardia   . Chronic kidney disease   . Chronic kidney disease, stage III (moderate) 08/03/2014  . Chronic pain   . Closed fracture of distal end of right radius 06/06/2014  . Depression   . Depression   . Diabetes mellitus without complication (HCC)   . Essential hypertension, benign 10/10/2010  . Hypertension   . Low back pain   . Myasthenia gravis without (acute) exacerbation (HCC)   . Obesity   . Organic sleep apnea   . 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 Restless leg syndrome   . Tinnitus   . Type II diabetes mellitus (HCC)    Type II or unspecified type diabetes mellitus without mention of complication, not stated as uncontrolled (CMS-HCC)    Past Surgical History:  Procedure Laterality Date  . ABDOMINAL HYSTERECTOMY    . CERVICAL SPINE SURGERY    . CHOLECYSTECTOMY  12/2012  . LUMBAR SPINE SURGERY    . TOTAL HIP ARTHROPLASTY Left 06/12/2017   Procedure: TOTAL HIP ARTHROPLASTY ANTERIOR APPROACH;  Surgeon: Kennedy Bucker, MD;  Location: [facility] ORS;  Service: Orthopedics;  Laterality: Left;     reports that she has never smoked. She has never used smokeless tobacco. She reports that she does not drink alcohol or use drugs.  Allergies  Allergen Reactions  . Magnesium Shortness Of Breath    Patient has myasthenia. Mg will result in  diaphragmatic weakness   . Ativan [Lorazepam] Other (See Comments)    Hallicinations  . Botox [Botulinum Toxin Type A] Other (See Comments)    Reaction: unknown Daughter does not think patient has ever received  . Codeine Hives    Daughter states that the patient does not always react  . Dilaudid [Hydromorphone] Other (See Comments)    Hallicinations  . Magnesium-Containing Compounds Hives  . Onabotulinumtoxina     Other reaction(s): Other (See Comments) Myasthenia gravis patient   . Penicillamine     Other reaction(s): Other (See Comments) Myasthenia gravis  . Penicillins Hives, Rash and Other (See Comments)    Has patient had a PCN reaction causing immediate rash, facial/tongue/throat swelling, SOB or lightheadedness with hypotension: Yes Has patient had a PCN reaction causing severe rash involving mucus membranes or skin necrosis: No Has patient had a PCN reaction that required hospitalization: No Has patient had a PCN reaction occurring within the last 10 years: Unknown If all of the above answers are "NO", then may proceed with Cephalosporin use.  myathenia gravis patient      Family History  Problem Relation Age of Onset  . Diabetes Mellitus II Other   . Alzheimer's disease Mother   . Cancer Mother   . Coronary artery disease Mother   . Asthma Father   . Coronary artery disease  Father   . Diabetes type II Father   . Dementia Sister   . Alzheimer's disease Brother   . Sleep apnea Paternal Grandfather   . Sleep apnea Daughter   . Dementia Sister     Prior to Admission medications   Medication Sig Start Date End Date Taking? Authorizing Provider  acetaminophen (TYLENOL) 650 MG CR tablet Take 650 mg by mouth 3 (three) times daily.     [provider]  dexamethasone (DECADRON) 6 MG tablet Take 6 mg by mouth daily. 01/31/19   [provider]  ferrous [id number] C-folic acid (TRINSICON / FOLTRIN) capsule Take 1 capsule by mouth 2 (two)  times daily. 06/14/17   Gladstone Lighter, MD  furosemide (LASIX) 20 MG tablet Take 20 mg by mouth.     [provider]  loratadine (CLARITIN) 10 MG tablet Take 10 mg by mouth daily.    [provider]  Melatonin 5 MG TABS Take 1 tablet by mouth at bedtime.     [provider]  memantine (NAMENDA) 10 MG tablet Take 10 mg by mouth 2 (two) times daily. 01/15/19   [provider]  Multiple Vitamin (MULTIVITAMIN) tablet Take 1 tablet by mouth daily.    [provider]  mycophenolate (CELLCEPT) 500 MG tablet Take 1,000 mg by mouth 2 (two) times daily.     [provider]  potassium chloride (K-DUR) 10 MEQ tablet Take 10 mEq by mouth daily.    [provider]  pyridostigmine (MESTINON) 60 MG tablet Take 1 tablet (60 mg total) by mouth 3 (three) times daily. 06/14/17   Gladstone Lighter, MD  QUEtiapine (SEROQUEL) 50 MG tablet Take 75 mg by mouth 2 (two) times daily. 1 1/2 tablets (75 mg)    [provider]  ranitidine (ZANTAC) 150 MG capsule Take 150 mg by mouth 2 (two) times daily.     [provider]  sertraline (ZOLOFT) 100 MG tablet Take 100 mg by mouth daily.     [provider]  Vitamin D, Ergocalciferol, (DRISDOL) 1.25 MG (50000 UT) CAPS capsule Take 50,000 Units by mouth once a week.    [provider]    Physical Exam: There were no vitals filed for this visit.    Constitutional: NAD, calm, comfortable There were no vitals filed for this visit. Eyes: PERRL, lids and conjunctivae normal ENMT: Mucous membranes are moist. Posterior pharynx clear of any exudate or lesions.Normal dentition.  Neck: normal, supple, no masses, no thyromegaly Respiratory: clear to auscultation bilaterally, no wheezing, no crackles. Normal respiratory effort. No accessory muscle use.  Cardiovascular: Regular rate and rhythm, no murmurs / rubs / gallops. No extremity edema. 2+ pedal pulses. No carotid bruits.   Abdomen: no tenderness, no masses palpated. No hepatosplenomegaly. Bowel sounds positive.  Musculoskeletal: no clubbing / cyanosis. No joint deformity upper and lower extremities. Good ROM, no contractures. Normal muscle tone.  Skin: no rashes, lesions, ulcers. No induration Neurologic: CN 2-12 grossly intact. dementia Psychiatric:  agitated   Labs on Admission: I have personally reviewed following labs and imaging studies  CBC: Recent Labs  Lab 02/19/2019 0940  WBC 10.5  NEUTROABS 9.3*  HGB 14.0  HCT 42.7  MCV 93.4  PLT 778   Basic Metabolic Panel: Recent Labs  Lab 02/06/2019 0940  NA 143  K 3.2*  CL 107  CO2 22  GLUCOSE 141*  BUN 18  CREATININE 0.77  CALCIUM 9.1   GFR: Estimated Creatinine Clearance: 65.1 mL/min (by C-G  formula based on SCr of 0.77 mg/dL). Liver Function Tests: Recent Labs  Lab Jan 19, 2019 0940  AST 30  ALT 14  ALKPHOS 76  BILITOT 0.8  PROT 6.6  ALBUMIN 3.0*   No results for input(s): LIPASE, AMYLASE in the last 168 hours. No results for input(s): AMMONIA in the last 168 hours. Coagulation Profile: Recent Labs  Lab Jan 19, 2019 1501  INR 1.1   Cardiac Enzymes: No results for input(s): CKTOTAL, CKMB, CKMBINDEX, TROPONINI in the last 168 hours. BNP (last 3 results) No results for input(s): PROBNP in the last 8760 hours. HbA1C: No results for input(s): HGBA1C in the last 72 hours. CBG: No results for input(s): GLUCAP in the last 168 hours. Lipid Profile: Recent Labs    Jan 19, 2019 0940  TRIG 95   Thyroid Function Tests: No results for input(s): TSH, T4TOTAL, FREET4, T3FREE, THYROIDAB in the last 72 hours. Anemia Panel: Recent Labs    Jan 19, 2019 0940  FERRITIN 754*   Urine analysis:    Component Value Date/Time   COLORURINE YELLOW (A) 06/12/2017 1055   APPEARANCEUR CLEAR (A) 06/12/2017 1055   APPEARANCEUR Cloudy 04/14/2013 1800   LABSPEC 1.026 06/12/2017 1055   LABSPEC 1.036 04/14/2013 1800   PHURINE 5.0 06/12/2017 1055   GLUCOSEU  NEGATIVE 06/12/2017 1055   GLUCOSEU Negative 04/14/2013 1800   HGBUR NEGATIVE 06/12/2017 1055   BILIRUBINUR NEGATIVE 06/12/2017 1055   BILIRUBINUR 1+ 04/14/2013 1800   KETONESUR 5 (A) 06/12/2017 1055   PROTEINUR NEGATIVE 06/12/2017 1055   NITRITE NEGATIVE 06/12/2017 1055   LEUKOCYTESUR NEGATIVE 06/12/2017 1055   LEUKOCYTESUR 3+ 04/14/2013 1800   Sepsis Labs: !!!!!!!!!!!!!!!!!!!!!!!!!!!!!!!!!!!!!!!!!!!! @LABRCNTIP (procalcitonin:4,lacticidven:4) )No results found for this or any previous visit (from the past 240 hour(s)).   Radiological Exams on Admission: Dg Chest Port 1 View  Result Date: 02/12/2019 CLINICAL DATA:  Shortness of breath. Reported COVID-19 positive. Hypertension. EXAM: PORTABLE CHEST 1 VIEW COMPARISON:  June 11, 2017 FINDINGS: There is patchy airspace opacity throughout the lungs in both upper and lower lobe regions. No consolidation. No appreciable pleural effusion. Heart is mildly enlarged with pulmonary vascularity within normal limits. No adenopathy. There is aortic atherosclerosis. Bones are osteoporotic.  Degenerative change noted in each shoulder. IMPRESSION: Patchy airspace opacity in the lungs bilaterally consistent with multifocal pneumonia. Suspect atypical organism pneumonia, particularly given COVID-19 positive state. No consolidation or pleural effusion. Mild cardiomegaly. No adenopathy. Aortic Atherosclerosis (ICD10-I70.0). Bones osteoporotic. Electronically Signed   By: Bretta BangWilliam  Woodruff III M.D.   On: Oct 19, 202020 09:41   cxr reviewed bilataral opacities Old chart reviewed  Assessment/Plan 78 yo female with acute hypoxic resp failure secondary to bilateral covid pna  Principal Problem:   Pneumonia due to COVID-19 virus- remdisivier and decadron ordered.  Along with lovenox/vitc/zinc.  02 supplementation.  Ativan for agitation prn.    Active Problems:   Acute respiratory failure due to COVID-19 [facility](HCC)- as above.  Treat covid   Alzheimer's disease with late  onset (HCC)- stable   CKD (chronic kidney disease)- stable   Myasthenia gravis (HCC)- stable   HTN (hypertension)- stable   DM (diabetes mellitus) (HCC)- ssi    DVT prophylaxis: lovenox Code Status:  No cpr, trial of vent Family Communication:  none Disposition Plan:  days Consults called:  none Admission status:  admission   Tuana Hoheisel A MD [facility]  If 7PM-7AM, please contact night-coverage [email or web address] Password [email or web address]  02/27/2019, 11:08 PM

## 2019-02-05 NOTE — ED Notes (Signed)
Pt confused, continues to remove nasal cannula.  Pt placed on NRB at this time.  EDP, [place] aware.

## 2019-02-05 NOTE — Treatment Plan (Addendum)
78 yo with CKD, depression, DM, HTN, myasthenia gravis, and obesity presenting with COVID 78 dx at [facility].  Requiring nonrebreather at this point.  HR in 50's, RR most recently 28.  Normotensive.  Labs notable for mild hypokalemia, elevated LDH, ferritin, mildly elevated lactic acid.  Given dose of decadron.  She's DNR.  Requested d dimer, but there's been difficulty collecting this at [facility], pending collection at this time.  Will accept to progressive care at [place].  EDP will f/u pending d dimer if able to get this prior to transfer.    Discussed with Dr. Ellender Hose.  D dimer elevated.  If stable for imaging, would request this.  If unable to get imaging, would empirically start anticoagulation until VTE can be r/o (would need high dose prophylaxis if this is negative).    Hold cellcept when pt arrives.  Continue mestinon for myasthenia.  Follow q shift nifs and FVC's.  Discussed with neurology.

## 2019-02-05 NOTE — ED Notes (Signed)
Safety sitter at bedside 

## 2019-02-05 NOTE — ED Notes (Signed)
Patient continues to be restless, and pulling at lines and NRB mask, constant redirection required.  Safety sitter remains at bedside.  EDP, Isaacs aware.

## 2019-02-05 NOTE — ED Provider Notes (Addendum)
Assumed care from Dr. Corky Downs at 3 PM. Briefly, the patient is a 78 y.o. 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 here with COVID-19 PNA with hypoxia.   Labs Reviewed  LACTIC ACID, PLASMA - Abnormal; Notable for the following components:      Result Value   Lactic Acid, Venous 2.5 (*)    All other components within normal limits  CBC WITH DIFFERENTIAL/PLATELET - Abnormal; Notable for the following components:   Neutro Abs 9.3 (*)    Lymphs Abs 0.5 (*)    Abs Immature Granulocytes 0.09 (*)    All other components within normal limits  COMPREHENSIVE METABOLIC PANEL - Abnormal; Notable for the following components:   Potassium 3.2 (*)    Glucose, Bld 141 (*)    Albumin 3.0 (*)    All other components within normal limits  LACTATE DEHYDROGENASE - Abnormal; Notable for the following components:   LDH 250 (*)    All other components within normal limits  FERRITIN - Abnormal; Notable for the following components:   Ferritin 754 (*)    All other components within normal limits  C-REACTIVE PROTEIN - Abnormal; Notable for the following components:   CRP 13.8 (*)    All other components within normal limits  FIBRIN DERIVATIVES D-DIMER ([facility] ONLY) - Abnormal; Notable for the following components:   Fibrin derivatives D-dimer ([facility]) >7,500.00 (*)    All other components within normal limits   FIBRINOGEN - Abnormal; Notable for the following components:   Fibrinogen 626 (*)    All other components within normal limits  CULTURE, BLOOD (ROUTINE X 2)  CULTURE, BLOOD (ROUTINE X 2)  PROCALCITONIN  TRIGLYCERIDES  LACTIC ACID, PLASMA  LACTIC ACID, PLASMA  HEPARIN LEVEL (UNFRACTIONATED)  CBC  APTT  PROTIME-INR    Course of Care: -Reviewed clinical course, labs, notes. Pt here with severe hypoxic resp failure 2/2 COVID-19. Procal neg. Decadron started. Of note, pt has a h/o myasthenia and is on mestinon/cellcept. On my exam, she appears alert, at her baseline but with severe hypoxia. Intermittently attempting to take her mask off. I discussed with hospitalist, Dr. Florene Glen. Will start mestinon, remdesevir. Of note, I also reviewed D-Dimer which is above detection threshold. Unable to obtain CT Angio 2/2 her hypoxia, so will start empirically on Heparin. [facility] is aware and in agreement. -I also discussed with pt's daughter Lance Coon [phone] via telephone. Confirmed pt is DNR but is NOT DNI. She is aware that pt would likely not be able to come off Ventilator given her age, hypoxia, MS, etc., but is not ready to make decision at this time.  -Patient increasingly agitated here. I suspect this is 2/2 sundowning/dementia - has a h/o same. Given a dose of seroquel (takes higher dose BID at home), with minimal improvement. She at least is not taking her mask off now. Pt is otherwise HDS and remains stable. Transfer to [place].   .Critical Care Performed by: Duffy Bruce, MD Authorized by: Duffy Bruce, MD   Critical care provider statement:  Critical care time (minutes):  70   Critical care time was exclusive of:  Separately billable procedures and treating other patients and teaching time   Critical care was necessary to treat or prevent imminent or life-threatening deterioration of the following conditions:  Cardiac failure, circulatory failure, respiratory failure and sepsis    Critical care was time spent personally by me on the following activities:  Development of treatment plan with patient or surrogate, discussions with consultants, evaluation of patient's response to treatment, examination of patient, obtaining history from patient or surrogate, ordering and performing treatments and interventions, ordering and review of laboratory studies, ordering and review of radiographic studies, pulse oximetry, re-evaluation of patient's condition and review of old charts   I assumed direction of critical care for this patient from another provider in my specialty: no        Shaune Pollack, MD 02/21/2019 2112    Shaune Pollack, MD 02/03/2019 2112

## 2019-02-05 NOTE — ED Provider Notes (Signed)
[facility] Emergency Department Provider Note   ____________________________________________    I have reviewed the triage vital signs and the nursing notes.   HISTORY  Chief Complaint Respiratory Distress     HPI Glenda Stephens is a 78 y.o. female with history of Alzheimer's disease, diabetes, hypertension who presents from nursing home with positive Covid.  Sent for respiratory distress, shortness of breath.  Patient is unable to provide significant history due to dementia although she does appear to answer questions appropriately.  EMS reports the patient was satting in the mid 60s upon their arrival, they placed her on nonrebreather with significant improvement.  Past Medical History:  Diagnosis Date  . Alzheimer disease (HCC)   . Anxiety   . Bradycardia   . Chronic kidney disease   . Chronic kidney disease, stage III (moderate) 08/03/2014  . Chronic pain   . Closed fracture of distal end of right radius 06/06/2014  . Depression   . Depression   . Diabetes mellitus without complication (HCC)   . Essential hypertension, benign 10/10/2010  . Hypertension   . Low back pain   . Myasthenia gravis without (acute) exacerbation (HCC)   . Obesity   . Organic sleep apnea   . 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 Restless leg syndrome   . Tinnitus   . Type II diabetes mellitus (HCC)    Type II or unspecified type diabetes mellitus without mention of complication, not stated as uncontrolled (CMS-HCC)    Patient Active Problem List   Diagnosis Date Noted  . Alzheimer's disease with late onset (HCC) 06/25/2017  . 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 Hip fracture (HCC) 06/12/2017  . Multifocal pneumonia 06/04/2016    Past Surgical History:   Procedure Laterality Date  . ABDOMINAL HYSTERECTOMY    . CERVICAL SPINE SURGERY    . CHOLECYSTECTOMY  12/2012  . LUMBAR SPINE SURGERY    . TOTAL HIP ARTHROPLASTY Left 06/12/2017   Procedure: TOTAL HIP ARTHROPLASTY ANTERIOR APPROACH;  Surgeon: Kennedy Bucker, MD;  Location: [facility] ORS;  Service: Orthopedics;  Laterality: Left;    Prior to Admission medications   Medication Sig Start Date End Date Taking? Authorizing Provider  acetaminophen (TYLENOL) 650 MG CR tablet Take 650 mg by mouth 3 (three) times daily.    Yes [provider]  dexamethasone (DECADRON) 6 MG tablet Take 6 mg by mouth daily. 01/31/19  Yes [provider]  ferrous fumarate-b12-vitamic C-folic acid (TRINSICON / FOLTRIN) capsule Take 1 capsule by mouth 2 (two) times daily. 06/14/17  Yes Enid Baas, MD  furosemide (LASIX) 20 MG tablet Take 20 mg by mouth.    Yes [provider]  loratadine (CLARITIN) 10 MG tablet Take 10 mg by mouth daily.   Yes [provider]  Melatonin 5 MG TABS Take 1 tablet by mouth at bedtime.    Yes [provider]  memantine (NAMENDA) 10 MG tablet Take 10 mg by mouth 2 (two) times daily. 01/15/19  Yes [provider]  Multiple Vitamin (MULTIVITAMIN) tablet Take 1 tablet by mouth daily.   Yes [provider]  mycophenolate (CELLCEPT) 500 MG tablet Take 1,000 mg by mouth 2 (two) times daily.    Yes [provider]  potassium chloride (K-DUR) 10 MEQ tablet Take  10 mEq by mouth daily.   Yes [provider]  pyridostigmine (MESTINON) 60 MG tablet Take 1 tablet (60 mg total) by mouth 3 (three) times daily. 06/14/17  Yes Enid BaasKalisetti, Radhika, MD  sertraline (ZOLOFT) 100 MG tablet Take 100 mg by mouth daily.    Yes [provider]  Vitamin D, Ergocalciferol, (DRISDOL) 1.25 MG (50000 UT) CAPS capsule Take 50,000 Units by mouth once a week.   Yes [provider]  QUEtiapine (SEROQUEL) 50 MG tablet Take 75 mg by  mouth 2 (two) times daily. 1 1/2 tablets (75 mg)    [provider]  ranitidine (ZANTAC) 150 MG capsule Take 150 mg by mouth 2 (two) times daily.     [provider]     Allergies Magnesium, Ativan [lorazepam], Botox [botulinum toxin type a], Codeine, Dilaudid [hydromorphone], Magnesium-containing compounds, Onabotulinumtoxina, Penicillamine, and Penicillins  Family History  Problem Relation Age of Onset  . Diabetes Mellitus II Other   . Alzheimer's disease Mother   . Cancer Mother   . Coronary artery disease Mother   . Asthma Father   . Coronary artery disease Father   . Diabetes type II Father   . Dementia Sister   . Alzheimer's disease Brother   . Sleep apnea Paternal Grandfather   . Sleep apnea Daughter   . Dementia Sister     Social History Social History   Tobacco Use  . Smoking status: Never Smoker  . Smokeless tobacco: Never Used  Substance Use Topics  . Alcohol use: No  . Drug use: No    Review of Systems limited by dementia       ____________________________________________   PHYSICAL EXAM:  VITAL SIGNS: ED Triage Vitals  Enc Vitals Group     BP 02/22/2019 0916 122/61     Pulse Rate 02/08/2019 0916 (!) 57     Resp 02/01/2019 0916 (!) 33     Temp 02/07/2019 0916 98 F (36.7 C)     Temp Source 02/26/2019 0916 Axillary     SpO2 03/02/2019 0916 100 %     Weight 03/01/2019 0917 88 kg (194 lb)     Height 02/03/2019 0917 1.727 m (5\' 8" )     Head Circumference --      Peak Flow --      Pain Score --      Pain Loc --      Pain Edu? --      Excl. in GC? --     Constitutional: Alert   Nose: No congestion/rhinnorhea. Mouth/Throat: Mucous membranes are moist.    Cardiovascular: Normal rate, regular rhythm. Grossly normal heart sounds.  Good peripheral circulation. Respiratory: Increased respiratory effort with tachypnea, bibasilar Rales, no wheezing Gastrointestinal: Soft and nontender. No distention.    Musculoskeletal: No lower extremity  tenderness nor edema.  Warm and well perfused Neurologic:  Normal speech and language. No gross focal neurologic deficits are appreciated.  Skin:  Skin is warm, dry and intact. No rash noted. Psychiatric: Mood and affect are normal. Speech and behavior are normal.  ____________________________________________   LABS (all labs ordered are listed, but only abnormal results are displayed)  Labs Reviewed  LACTIC ACID, PLASMA - Abnormal; Notable for the following components:      Result Value   Lactic Acid, Venous 2.5 (*)    All other components within normal limits  CBC WITH DIFFERENTIAL/PLATELET - Abnormal; Notable for the following components:   Neutro Abs 9.3 (*)    Lymphs Abs  0.5 (*)    Abs Immature Granulocytes 0.09 (*)    All other components within normal limits  COMPREHENSIVE METABOLIC PANEL - Abnormal; Notable for the following components:   Potassium 3.2 (*)    Glucose, Bld 141 (*)    Albumin 3.0 (*)    All other components within normal limits  LACTATE DEHYDROGENASE - Abnormal; Notable for the following components:   LDH 250 (*)    All other components within normal limits  FERRITIN - Abnormal; Notable for the following components:   Ferritin 754 (*)    All other components within normal limits  CULTURE, BLOOD (ROUTINE X 2)  CULTURE, BLOOD (ROUTINE X 2)  PROCALCITONIN  TRIGLYCERIDES  C-REACTIVE PROTEIN  LACTIC ACID, PLASMA  FIBRIN DERIVATIVES D-DIMER ([facility] ONLY)  FIBRINOGEN  LACTIC ACID, PLASMA   ____________________________________________  EKG  ED ECG REPORT I, Lavonia Drafts, the attending physician, personally viewed and interpreted this ECG.  Date: 02/14/2019  Rhythm: normal sinus rhythm QRS Axis: normal Intervals: Bundle branch block ST/T Wave abnormalities: Non uNarrative Interpretation: no evidence of acute ischemia  ____________________________________________  RADIOLOGY  Chest x-ray with multifocal pneumonia  ____________________________________________   PROCEDURES  Procedure(s) performed: No  Procedures   Critical Care performed: yes  CRITICAL CARE Performed by: Lavonia Drafts   Total critical care time: 30 minutes  Critical care time was exclusive of separately billable procedures and treating other patients.  Critical care was necessary to treat or prevent imminent or life-threatening deterioration.  Critical care was time spent personally by me on the following activities: development of treatment plan with patient and/or surrogate as well as nursing, discussions with consultants, evaluation of patient's response to treatment, examination of patient, obtaining history from patient or surrogate, ordering and performing treatments and interventions, ordering and review of laboratory studies, ordering and review of radiographic studies, pulse oximetry and re-evaluation of patient's condition.  ____________________________________________   INITIAL IMPRESSION / ASSESSMENT AND PLAN / ED COURSE  Pertinent labs & imaging results that were available during my care of the patient were reviewed by me and considered in my medical decision making (see chart for details).  Patient presents with shortness of breath and respiratory failure in the setting of novel coronavirus infection.  Chest x-ray is consistent with COVID-19.  Initially we were able to maintain the patient on 6 L nasal cannula however we have had to increase her to a nonrebreather.    She does have a DNR form here.  EMS indicated that she is also a DNI however I do not have clarification on that  Spoken to hospitalist at [facility], has requested a D-dimer result prior to transfer.  Dexamethasone given  Accepted by Dr. Florene Glen at [facility]      ____________________________________________   FINAL CLINICAL IMPRESSION(S) / ED DIAGNOSES  Final diagnoses:  Acute respiratory failure with hypoxia ([place])   COVID-19        Note:  This document was prepared using Dragon voice recognition software and may include unintentional dictation errors.   Lavonia Drafts, MD 02/08/2019 [phone]

## 2019-02-05 NOTE — ED Notes (Signed)
Repeat green and blue top sent to lab at this time.

## 2019-02-05 NOTE — ED Notes (Signed)
Lab tech with multiple unsuccessful attempts; states he will send another tech.

## 2019-02-05 NOTE — ED Notes (Signed)
Pt continues to pull NRB mask off w/ mittens in place.  Pt desat to 68%.  Charge RN, Annie Main notified of need for Air cabin crew.

## 2019-02-06 ENCOUNTER — Encounter (HOSPITAL_COMMUNITY): Payer: Self-pay

## 2019-02-06 DIAGNOSIS — J9600 Acute respiratory failure, unspecified whether with hypoxia or hypercapnia: Secondary | ICD-10-CM

## 2019-02-06 DIAGNOSIS — G301 Alzheimer's disease with late onset: Secondary | ICD-10-CM

## 2019-02-06 DIAGNOSIS — F0281 Dementia in other diseases classified elsewhere with behavioral disturbance: Secondary | ICD-10-CM

## 2019-02-06 DIAGNOSIS — G7000 Myasthenia gravis without (acute) exacerbation: Secondary | ICD-10-CM

## 2019-02-06 LAB — ABO/RH: ABO/RH(D): B POS

## 2019-02-06 LAB — CBC WITH DIFFERENTIAL/PLATELET
Abs Immature Granulocytes: 0.07 10*3/uL (ref 0.00–0.07)
Basophils Absolute: 0 10*3/uL (ref 0.0–0.1)
Basophils Relative: 0 %
Eosinophils Absolute: 0 10*3/uL (ref 0.0–0.5)
Eosinophils Relative: 0 %
HCT: 42.7 % (ref 36.0–46.0)
Hemoglobin: 13.8 g/dL (ref 12.0–15.0)
Immature Granulocytes: 1 %
Lymphocytes Relative: 7 %
Lymphs Abs: 0.7 10*3/uL (ref 0.7–4.0)
MCH: 30.7 pg (ref 26.0–34.0)
MCHC: 32.3 g/dL (ref 30.0–36.0)
MCV: 94.9 fL (ref 80.0–100.0)
Monocytes Absolute: 0.5 10*3/uL (ref 0.1–1.0)
Monocytes Relative: 5 %
Neutro Abs: 8.1 10*3/uL — ABNORMAL HIGH (ref 1.7–7.7)
Neutrophils Relative %: 87 %
Platelets: 196 10*3/uL (ref 150–400)
RBC: 4.5 MIL/uL (ref 3.87–5.11)
RDW: 12.3 % (ref 11.5–15.5)
WBC: 9.3 10*3/uL (ref 4.0–10.5)
nRBC: 0 % (ref 0.0–0.2)

## 2019-02-06 LAB — COMPREHENSIVE METABOLIC PANEL
ALT: 13 U/L (ref 0–44)
AST: 26 U/L (ref 15–41)
Albumin: 3 g/dL — ABNORMAL LOW (ref 3.5–5.0)
Alkaline Phosphatase: 75 U/L (ref 38–126)
Anion gap: 15 (ref 5–15)
BUN: 19 mg/dL (ref 8–23)
CO2: 22 mmol/L (ref 22–32)
Calcium: 9.1 mg/dL (ref 8.9–10.3)
Chloride: 110 mmol/L (ref 98–111)
Creatinine, Ser: 0.6 mg/dL (ref 0.44–1.00)
GFR calc Af Amer: >60 mL/min (ref >60)
GFR calc non Af Amer: >60 mL/min (ref >60)
Glucose, Bld: 105 mg/dL — ABNORMAL HIGH (ref 70–99)
Potassium: 3.4 mmol/L — ABNORMAL LOW (ref 3.5–5.1)
Sodium: 147 mmol/L — ABNORMAL HIGH (ref 135–145)
Total Bilirubin: 0.8 mg/dL (ref 0.3–1.2)
Total Protein: 6.5 g/dL (ref 6.5–8.1)

## 2019-02-06 LAB — GLUCOSE, CAPILLARY
Glucose-Capillary: 165 mg/dL — ABNORMAL HIGH (ref 70–99)
Glucose-Capillary: 110 mg/dL — ABNORMAL HIGH (ref 70–99)
Glucose-Capillary: 111 mg/dL — ABNORMAL HIGH (ref 70–99)

## 2019-02-06 LAB — D-DIMER, QUANTITATIVE: D-Dimer, Quant: >20 ug/mL-FEU — ABNORMAL HIGH (ref 0.00–0.50)

## 2019-02-06 LAB — C-REACTIVE PROTEIN: CRP: 17.6 mg/dL — ABNORMAL HIGH (ref <1.0)

## 2019-02-06 LAB — HEMOGLOBIN A1C
Hgb A1c MFr Bld: 6.3 % — ABNORMAL HIGH (ref 4.8–5.6)
Mean Plasma Glucose: 134.11 mg/dL

## 2019-02-06 LAB — HEPARIN LEVEL (UNFRACTIONATED): Heparin Unfractionated: 0.48 IU/mL (ref 0.30–0.70)

## 2019-02-06 LAB — MAGNESIUM: Magnesium: 1.9 mg/dL (ref 1.7–2.4)

## 2019-02-06 MED ORDER — PYRIDOSTIGMINE BROMIDE 60 MG PO TABS
60.0000 mg | ORAL_TABLET | Freq: Three times a day (TID) | ORAL | Status: DC
  Filled 2019-02-06 (×5): qty 1

## 2019-02-06 MED ORDER — HALOPERIDOL LACTATE 5 MG/ML IJ SOLN
2.0000 mg | Freq: Four times a day (QID) | INTRAMUSCULAR | Status: DC | PRN
  Administered 2019-02-06: 09:00:00 2 mg via INTRAVENOUS
  Filled 2019-02-06: qty 1

## 2019-02-06 MED ORDER — LOPERAMIDE HCL 2 MG PO CAPS: 2.0000 mg | ORAL_CAPSULE | ORAL | Status: DC | PRN

## 2019-02-06 MED ORDER — MEMANTINE HCL 10 MG PO TABS
10.0000 mg | ORAL_TABLET | Freq: Two times a day (BID) | ORAL | Status: DC
  Administered 2019-02-06: 10 mg via ORAL
  Filled 2019-02-06 (×3): qty 1

## 2019-02-06 MED ORDER — HALOPERIDOL LACTATE 5 MG/ML IJ SOLN
4.0000 mg | Freq: Four times a day (QID) | INTRAMUSCULAR | Status: DC | PRN
  Administered 2019-02-06 – 2019-02-07 (×4): 4 mg via INTRAVENOUS
  Filled 2019-02-06 (×5): qty 1

## 2019-02-06 MED ORDER — MYCOPHENOLATE MOFETIL 250 MG PO CAPS
1000.0000 mg | ORAL_CAPSULE | Freq: Two times a day (BID) | ORAL | Status: DC
  Administered 2019-02-06: 1000 mg via ORAL
  Filled 2019-02-06 (×3): qty 4

## 2019-02-06 MED ORDER — ENOXAPARIN SODIUM 40 MG/0.4ML ~~LOC~~ SOLN
40.0000 mg | Freq: Two times a day (BID) | SUBCUTANEOUS | Status: DC
  Administered 2019-02-06 (×2): 40 mg via SUBCUTANEOUS
  Filled 2019-02-06 (×2): qty 0.4

## 2019-02-06 MED ORDER — LORAZEPAM 2 MG/ML IJ SOLN
1.0000 mg | INTRAMUSCULAR | Status: DC | PRN
  Administered 2019-02-06 – 2019-02-07 (×4): 1 mg via INTRAVENOUS
  Filled 2019-02-06 (×4): qty 1

## 2019-02-06 MED ORDER — HEPARIN (PORCINE) 25000 UT/250ML-% IV SOLN: 1300.0000 [IU]/h | INTRAVENOUS | Status: DC

## 2019-02-06 MED ORDER — QUETIAPINE FUMARATE 25 MG PO TABS: 75.0000 mg | ORAL_TABLET | Freq: Two times a day (BID) | ORAL | Status: DC

## 2019-02-06 NOTE — Progress Notes (Signed)
PROGRESS NOTE                                                                                                                                                                                                             Patient Demographics:    Glenda Stephens, is a 78 y.o. female, DOB - 05-Aug-1940, TDD:[id number]  Outpatient Primary MD for the patient is Venia Carbon, MD   Admit date - 02/02/2019   LOS - 1  No chief complaint on file.      Brief Narrative: Patient is a 78 y.o. female with PMHx of myasthenia gravis, severe dementia, chronic debility/frailty-mostly bed to wheelchair bound-presented with shortness of breath, worsening mental status-found to have acute hypoxic respiratory failure secondary to COVID-19 pneumonia.   Subjective:    Glenda Stephens today was on 15 L of oxygen this morning-very agitated confused.  Per nursing staff significant amount of diarrhea earlier.   Assessment  & Plan :   Acute Hypoxic Resp Failure due to Covid 19 Viral pneumonia: Has severe disease-very poor prognosis-extensive discussion with daughter over the phone-continue steroids/remdesivir-watch for a day or so to see if patient improves-if no improvement or if she declines any further we will transition to comfort care.    Fever: afebrile  O2 requirements: On NRB  COVID-19 Labs: Recent Labs    02/27/2019 0940 02/23/2019 0941 02/06/19 0013  DDIMER  --   --  >20.00*  FERRITIN 754*  --   --   LDH 250*  --   --   CRP  --  13.8* 17.6*    No results found for: SARSCOV2NAA   COVID-19 Medications: Steroids: 11/5>> Remdesivir: 11/5>> Actemra: On CellCept-given severe disease-poor overall prognosis-we will avoid. Convalescent Plasma:Given severe disease-poor overall prognosis-we will avoid. Research Studies:N/A  Other medications: Diuretics:Euvolemic-no need for lasix Antibiotics:Not needed as no evidence of bacterial  infection   Prone/Incentive Spirometry: Due to dementia-not possible.  DVT Prophylaxis  : Stop heparin gtt.-transition to Lovenox twice daily.  Acute metabolic encephalopathy superimposed on advanced dementia: Metabolic encephalopathy secondary to severe hypoxemia-supportive care-discontinue telemetry-minimize interruptions as much as possible-Haldol as needed.  Hypernatremia: Secondary to poor oral intake-supportive care  Diarrhea: secondary to covid-prn imodiium  Myasthenia gravis: Resume Mestinon and CellCept- if she is able to tolerate oral intake.  Dementia: Appears to be advanced-per daughter-bed to  wheelchair bound-but able to feed herself-resume Namenda, Seroquel and Zoloft.  Will use Haldol as needed for ongoing agitation.  Palliative care: Spoke at length to patient's daughter over the phone-she is a DNR-we will plan on observing her for 1 day with steroids and remdesivir-if she continues to deteriorate she will be transitioned to hospice/comfort care.  ABG: No results found for: PHART, PCO2ART, PO2ART, HCO3, TCO2, ACIDBASEDEF, O2SAT  Vent Settings: N/A  Condition - Extremely Guarded-very tenuous with risk for further deterioration  Family Communication  :  Daughter updated over the phone  Code Status :  DNR  Diet :  Diet Order            Diet full liquid Room service appropriate? Yes; Fluid consistency: Thin  Diet effective now               Disposition Plan  :  Remain hospitalized  Barriers to discharge: Hypoxia requiring O2 supplementation/complete 5 days of IV Remdesivir  Consults  :  None  Procedures  : None  Antibiotics  :    Anti-infectives (From admission, onward)   Start     Dose/Rate Route Frequency Ordered Stop   02/06/19 1600  remdesivir 100 mg in sodium chloride 0.9 % 250 mL IVPB     100 mg 500 mL/hr over 30 Minutes Intravenous Daily 02-27-2019 2311 02/10/19 1559      Inpatient Medications  Scheduled Meds: . dexamethasone (DECADRON)  injection  6 mg Intravenous Q24H  . enoxaparin (LOVENOX) injection  40 mg Subcutaneous Q12H  . insulin aspart  0-9 Units Subcutaneous TID WC  . sodium chloride flush  3 mL Intravenous Q12H  . vitamin C  500 mg Oral Daily  . zinc sulfate  220 mg Oral Daily   Continuous Infusions: . sodium chloride    . remdesivir 200 mg in NS 250 mL     PRN Meds:.sodium chloride, acetaminophen, guaiFENesin-dextromethorphan, haloperidol lactate, ondansetron **OR** ondansetron (ZOFRAN) IV, sodium chloride flush   Time Spent in minutes  35   See all Orders from today for further details   Jeoffrey Massed M.D on 02/06/2019 at 8:09 AM  To page go to [email or web address] - use universal password  [facility] -  Office  [phone]    Objective:   Vitals:   02-27-19 2236 02/06/19 0505 02/06/19 0806  BP:   136/86  Pulse: 60    Resp: 16 18 (!) 25  Temp: 98.9 F (37.2 C) 98.7 F (37.1 C) (!) 96.3 F (35.7 C)  TempSrc: Oral Oral Axillary  SpO2:  99%   Weight: 81.6 kg 81.6 kg   Height: 5\' 8"  (1.727 m) 5\' 8"  (1.727 m)     Wt Readings from Last 3 Encounters:  02/06/19 81.6 kg  February 27, 2019 88 kg  07/03/17 95.9 kg     Intake/Output Summary (Last 24 hours) at 02/06/2019 0809 Last data filed at 02/06/2019 0500 Gross per 24 hour  Intake -  Output 300 ml  Net -300 ml     Physical Exam Gen Exam: Awake very confused HEENT:atraumatic, normocephalic Chest: B/L clear to auscultation anteriorly CVS:S1S2 regular Abdomen:soft non tender, non distended Extremities:no edema Neurology: difficult exam but seems to be moving all 4 extremities Skin: no rash   Data Review:    CBC Recent Labs  Lab 27-Feb-2019 0940 02/06/19 0013  WBC 10.5 9.3  HGB 14.0 13.8  HCT 42.7 42.7  PLT 210 196  MCV 93.4 94.9  MCH 30.6 30.7  MCHC 32.8 32.3  RDW 12.3 12.3  LYMPHSABS 0.5* 0.7  MONOABS 0.5 0.5  EOSABS 0.0 0.0  BASOSABS 0.0 0.0    Chemistries  Recent Labs  Lab 10-Feb-2019 0940 02/06/19 0013  NA 143  147*  K 3.2* 3.4*  CL 107 110  CO2 22 22  GLUCOSE 141* 105*  BUN 18 19  CREATININE 0.77 0.60  CALCIUM 9.1 9.1  MG  --  1.9  AST 30 26  ALT 14 13  ALKPHOS 76 75  BILITOT 0.8 0.8   ------------------------------------------------------------------------------------------------------------------ Recent Labs    10-Feb-2019 0940  TRIG 95    Lab Results  Component Value Date   HGBA1C 6.3 (H) 02/06/2019   ------------------------------------------------------------------------------------------------------------------ No results for input(s): TSH, T4TOTAL, T3FREE, THYROIDAB in the last 72 hours.  Invalid input(s): FREET3 ------------------------------------------------------------------------------------------------------------------ Recent Labs    10-Feb-2019 0940  FERRITIN 754*    Coagulation profile Recent Labs  Lab 10-Feb-2019 1501  INR 1.1    Recent Labs    02/06/19 0013  DDIMER >20.00*    Cardiac Enzymes No results for input(s): CKMB, TROPONINI, MYOGLOBIN in the last 168 hours.  Invalid input(s): CK ------------------------------------------------------------------------------------------------------------------ No results found for: BNP  Micro Results No results found for this or any previous visit (from the past 240 hour(s)).  Radiology Reports Dg Chest Port 1 View  Result Date: 02/18/2019 CLINICAL DATA:  Shortness of breath. Reported COVID-19 positive. Hypertension. EXAM: PORTABLE CHEST 1 VIEW COMPARISON:  June 11, 2017 FINDINGS: There is patchy airspace opacity throughout the lungs in both upper and lower lobe regions. No consolidation. No appreciable pleural effusion. Heart is mildly enlarged with pulmonary vascularity within normal limits. No adenopathy. There is aortic atherosclerosis. Bones are osteoporotic.  Degenerative change noted in each shoulder. IMPRESSION: Patchy airspace opacity in the lungs bilaterally consistent with multifocal pneumonia.  Suspect atypical organism pneumonia, particularly given COVID-19 positive state. No consolidation or pleural effusion. Mild cardiomegaly. No adenopathy. Aortic Atherosclerosis (ICD10-I70.0). Bones osteoporotic. Electronically Signed   By: Bretta BangWilliam  Woodruff III M.D.   On: 10-Nov-202020 09:41

## 2019-02-06 NOTE — Progress Notes (Signed)
Pt. Very agitated, O2 increased to 15L high flow. Sats 80's . RN notified doctor. Orders received.

## 2019-02-06 NOTE — TOC Initial Note (Signed)
Transition of Care [facility]) - Initial/Assessment Note    Patient Details  Name: Glenda Stephens MRN: [id number] Date of Birth: [birth year]  Transition of Care [facility]) CM/SW Contact:    Elliot Gault, LCSW Phone Number: 02/06/2019, 12:24 PM  Clinical Narrative:                  Pt admitted from [facility]. Spoke with Sue Lush at [facility]. Per Sue Lush, pt is in long term care. Pt has dementia. She can self propel in her wheelchair. [facility] will accept pt back when she is stable for dc.  TOC team will follow.  Expected Discharge Plan: Long Term Nursing Home Barriers to Discharge: Continued Medical Work up   Patient Goals and CMS Choice        Expected Discharge Plan and Services Expected Discharge Plan: Long Term Nursing Home In-house Referral: Clinical Social Work     Living arrangements for the past 2 months: Skilled Nursing Facility                                      Prior Living Arrangements/Services Living arrangements for the past 2 months: Skilled Nursing Facility Lives with:: Facility Resident Patient language and need for interpreter reviewed:: Yes Do you feel safe going back to the place where you live?: Yes      Need for Family Participation in Patient Care: No (Comment) Care giver support system in place?: Yes (comment)   Criminal Activity/Legal Involvement Pertinent to Current Situation/Hospitalization: No - Comment as needed  Activities of Daily Living Home Assistive Devices/Equipment: None ADL Screening (condition at time of admission) Patient's cognitive ability adequate to safely complete daily activities?: Yes Is the patient deaf or have difficulty hearing?: Yes Does the patient have difficulty seeing, even when wearing glasses/contacts?: Yes Does the patient have difficulty concentrating, remembering, or making decisions?: Yes Patient able to express need for assistance with ADLs?: Yes Does the patient have difficulty dressing or  bathing?: Yes Independently performs ADLs?: No Communication: Dependent Is this a change from baseline?: Pre-admission baseline Dressing (OT): Dependent Is this a change from baseline?: Pre-admission baseline Grooming: Dependent Is this a change from baseline?: Pre-admission baseline Bathing: Dependent Is this a change from baseline?: Pre-admission baseline Toileting: Dependent Is this a change from baseline?: Pre-admission baseline In/Out Bed: Dependent Is this a change from baseline?: Pre-admission baseline Walks in Home: Dependent Is this a change from baseline?: Pre-admission baseline Does the patient have difficulty walking or climbing stairs?: Yes Weakness of Legs: Both Weakness of Arms/Hands: Both  Permission Sought/Granted                  Emotional Assessment       Orientation: : Oriented to Self Alcohol / Substance Use: Not Applicable Psych Involvement: No (comment)  Admission diagnosis:  COVID POSITIVE Patient Active Problem List   Diagnosis Date Noted  . Pneumonia due to COVID-19 virus 02/15/2019  . Acute respiratory failure due to COVID-19 (HCC) 03/02/2019  . CKD (chronic kidney disease) 03/02/2019  . Myasthenia gravis (HCC) 02/28/2019  . HTN (hypertension) 02/06/2019  . DM (diabetes mellitus) (HCC) 02/02/2019  . Alzheimer's disease with late onset (HCC) 06/25/2017  . 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 Hip fracture (HCC) 06/12/2017  . Multifocal pneumonia 06/04/2016   PCP:  Alphonsus Sias  Theophilus Kinds, MD Pharmacy:  No Pharmacies Listed    Social Determinants of Health (SDOH) Interventions    Readmission Risk Interventions Readmission Risk Prevention Plan 02/06/2019  Medication Screening Complete  Transportation Screening Complete  Some recent data might be hidden

## 2019-02-06 NOTE — Progress Notes (Signed)
RN spoke with daughter Izora Gala. Daughter asked for her moms picture. RN sent it.

## 2019-02-06 NOTE — Consult Note (Signed)
ANTICOAGULATION CONSULT NOTE - Initial Consult  Pharmacy Consult for Heparin Indication: pulmonary embolus in setting of COVID-19   Patient Measurements: Height: 5\' 8"  (172.7 cm) Weight: 179 lb 14.3 oz (81.6 kg) IBW/kg (Calculated) : 63.9 Heparin Dosing Weight: 82.3 kg  Vital Signs: Temp: (P) 98.7 F (37.1 C) (11/06 0505) Temp Source: (P) Oral (11/06 0505) BP: 147/76 (11/05 2100) Pulse Rate: 60 (11/05 2236)  Labs: Recent Labs    02/04/2019 0940 03/02/2019 1501 02/06/19 0013  HGB 14.0  --  13.8  HCT 42.7  --  42.7  PLT 210  --  196  APTT  --  26  --   LABPROT  --  14.3  --   INR  --  1.1  --   HEPARINUNFRC  --   --  0.48  CREATININE 0.77  --   --     Estimated Creatinine Clearance: 62.9 mL/min (by C-G formula based on SCr of 0.77 mg/dL).   Medical History: Past Medical History:  Diagnosis Date  . Alzheimer disease ([place])   . Anxiety   . Bradycardia   . Chronic kidney disease   . Chronic kidney disease, stage III (moderate) 08/03/2014  . Chronic pain   . Closed fracture of distal end of right radius 06/06/2014  . Depression   . Depression   . Diabetes mellitus without complication ([place])   . Essential hypertension, benign 10/10/2010  . Hypertension   . Low back pain   . Myasthenia gravis without (acute) exacerbation ([place])   . Obesity   . Organic sleep apnea   . 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 Restless leg syndrome   . Tinnitus   . Type II diabetes mellitus (HCC)    Type II or unspecified type diabetes mellitus without mention of complication, not stated as uncontrolled (CMS-HCC)    Medications:  No anticoagulation prior to admission per chart  Assessment: Patient is an 78 y/o F who presented to [facility] ED from nursing home with respiratory distress in setting of positive COVID test. D-dimer elevated >7500 ng/mL. Patient is normotensive. Pharmacy has been consulted for  heparin drip for PE. H&H, Plt WNL. Baseline APTT is WNL  02/06/19 0200 UPDATE: Heparin level : 0.46 IU/.mL at 0013 ( in therapeutic goal range)  Goal of Therapy:  Heparin level 0.3-0.7 units/ml Monitor platelets by anticoagulation protocol: Yes   Plan:  Continue heparin infusion at 1300 units/hr  Confirmatory HL in  ~8 hours    CBC and HL daily per protocol  Despina Pole, Pharm. D. Clinical Pharmacist 02/06/2019 5:17 AM

## 2019-02-07 LAB — CBC WITH DIFFERENTIAL/PLATELET
Abs Immature Granulocytes: 0.1 10*3/uL — ABNORMAL HIGH (ref 0.00–0.07)
Basophils Absolute: 0 10*3/uL (ref 0.0–0.1)
Basophils Relative: 0 %
Eosinophils Absolute: 0 10*3/uL (ref 0.0–0.5)
Eosinophils Relative: 0 %
HCT: 45.8 % (ref 36.0–46.0)
Hemoglobin: 14.8 g/dL (ref 12.0–15.0)
Immature Granulocytes: 1 %
Lymphocytes Relative: 5 %
Lymphs Abs: 0.6 10*3/uL — ABNORMAL LOW (ref 0.7–4.0)
MCH: 31 pg (ref 26.0–34.0)
MCHC: 32.3 g/dL (ref 30.0–36.0)
MCV: 95.8 fL (ref 80.0–100.0)
Monocytes Absolute: 0.6 10*3/uL (ref 0.1–1.0)
Monocytes Relative: 6 %
Neutro Abs: 9.8 10*3/uL — ABNORMAL HIGH (ref 1.7–7.7)
Neutrophils Relative %: 88 %
Platelets: 204 10*3/uL (ref 150–400)
RBC: 4.78 MIL/uL (ref 3.87–5.11)
RDW: 12.4 % (ref 11.5–15.5)
WBC: 11.1 10*3/uL — ABNORMAL HIGH (ref 4.0–10.5)
nRBC: 0 % (ref 0.0–0.2)

## 2019-02-07 LAB — FERRITIN: Ferritin: 807 ng/mL — ABNORMAL HIGH (ref 11–307)

## 2019-02-07 LAB — COMPREHENSIVE METABOLIC PANEL
ALT: 15 U/L (ref 0–44)
AST: 22 U/L (ref 15–41)
Albumin: 2.8 g/dL — ABNORMAL LOW (ref 3.5–5.0)
Alkaline Phosphatase: 83 U/L (ref 38–126)
Anion gap: 14 (ref 5–15)
BUN: 19 mg/dL (ref 8–23)
CO2: 22 mmol/L (ref 22–32)
Calcium: 9.1 mg/dL (ref 8.9–10.3)
Chloride: 113 mmol/L — ABNORMAL HIGH (ref 98–111)
Creatinine, Ser: 0.75 mg/dL (ref 0.44–1.00)
GFR calc Af Amer: >60 mL/min (ref >60)
GFR calc non Af Amer: >60 mL/min (ref >60)
Glucose, Bld: 125 mg/dL — ABNORMAL HIGH (ref 70–99)
Potassium: 3.1 mmol/L — ABNORMAL LOW (ref 3.5–5.1)
Sodium: 149 mmol/L — ABNORMAL HIGH (ref 135–145)
Total Bilirubin: 0.9 mg/dL (ref 0.3–1.2)
Total Protein: 6.4 g/dL — ABNORMAL LOW (ref 6.5–8.1)

## 2019-02-07 LAB — GLUCOSE, CAPILLARY
Glucose-Capillary: 123 mg/dL — ABNORMAL HIGH (ref 70–99)
Glucose-Capillary: 156 mg/dL — ABNORMAL HIGH (ref 70–99)

## 2019-02-07 LAB — C-REACTIVE PROTEIN: CRP: 20.7 mg/dL — ABNORMAL HIGH (ref <1.0)

## 2019-02-07 LAB — D-DIMER, QUANTITATIVE: D-Dimer, Quant: 13.29 ug/mL-FEU — ABNORMAL HIGH (ref 0.00–0.50)

## 2019-02-07 MED ORDER — MORPHINE 100MG IN NS 100ML (1MG/ML) PREMIX INFUSION
1.0000 mg/h | INTRAVENOUS | Status: DC
  Administered 2019-02-08: 2 mg/h via INTRAVENOUS
  Filled 2019-02-07 (×2): qty 100

## 2019-02-07 MED ORDER — MORPHINE SULFATE (PF) 2 MG/ML IV SOLN
2.0000 mg | INTRAVENOUS | Status: DC | PRN
  Administered 2019-02-07: 2 mg via SUBCUTANEOUS
  Administered 2019-02-07 (×2): 4 mg via SUBCUTANEOUS
  Filled 2019-02-07: qty 2
  Filled 2019-02-07: qty 1
  Filled 2019-02-07: qty 2

## 2019-02-07 MED ORDER — LORAZEPAM 2 MG/ML IJ SOLN: 1.0000 mg | INTRAMUSCULAR | Status: DC | PRN

## 2019-02-07 MED ORDER — DIPHENHYDRAMINE HCL 50 MG/ML IJ SOLN: 12.5000 mg | INTRAMUSCULAR | Status: DC | PRN

## 2019-02-07 MED ORDER — HALOPERIDOL LACTATE 2 MG/ML PO CONC
4.0000 mg | ORAL | Status: DC | PRN
  Administered 2019-02-07 – 2019-02-08 (×3): 4 mg via SUBLINGUAL
  Filled 2019-02-07 (×6): qty 2

## 2019-02-07 MED ORDER — FENTANYL BOLUS VIA INFUSION
20.0000 ug | INTRAVENOUS | Status: DC | PRN
  Filled 2019-02-07: qty 20

## 2019-02-07 MED ORDER — ACETAMINOPHEN 650 MG RE SUPP: 650.0000 mg | Freq: Four times a day (QID) | RECTAL | Status: DC | PRN

## 2019-02-07 MED ORDER — ACETAMINOPHEN 325 MG PO TABS: 650.0000 mg | ORAL_TABLET | Freq: Four times a day (QID) | ORAL | Status: DC | PRN

## 2019-02-07 MED ORDER — GLYCOPYRROLATE 0.2 MG/ML IJ SOLN
0.2000 mg | INTRAMUSCULAR | Status: DC | PRN
  Filled 2019-02-07: qty 1

## 2019-02-07 MED ORDER — LORAZEPAM 0.5 MG PO TABS: 1.0000 mg | ORAL_TABLET | ORAL | Status: DC | PRN

## 2019-02-07 MED ORDER — GLYCOPYRROLATE 0.2 MG/ML IJ SOLN: 0.2000 mg | INTRAMUSCULAR | Status: DC | PRN

## 2019-02-07 MED ORDER — HALOPERIDOL LACTATE 2 MG/ML PO CONC: 2.0000 mg | ORAL | Status: DC | PRN

## 2019-02-07 MED ORDER — LORAZEPAM 0.5 MG PO TABS
1.0000 mg | ORAL_TABLET | ORAL | Status: DC | PRN
  Administered 2019-02-07: 1 mg via ORAL
  Filled 2019-02-07: qty 2

## 2019-02-07 MED ORDER — HALOPERIDOL LACTATE 5 MG/ML IJ SOLN
5.0000 mg | INTRAMUSCULAR | Status: DC | PRN
  Administered 2019-02-08: 07:00:00 5 mg via INTRAVENOUS
  Filled 2019-02-07: qty 1

## 2019-02-07 MED ORDER — LORAZEPAM 2 MG/ML PO CONC: 1.0000 mg | ORAL | Status: DC | PRN

## 2019-02-07 MED ORDER — FENTANYL 2500MCG IN NS 250ML (10MCG/ML) PREMIX INFUSION
0.0000 ug/h | INTRAVENOUS | Status: DC
  Filled 2019-02-07: qty 250

## 2019-02-07 MED ORDER — ONDANSETRON HCL 4 MG/2ML IJ SOLN: 4.0000 mg | Freq: Four times a day (QID) | INTRAMUSCULAR | Status: DC | PRN

## 2019-02-07 MED ORDER — ONDANSETRON 4 MG PO TBDP: 4.0000 mg | ORAL_TABLET | Freq: Four times a day (QID) | ORAL | Status: DC | PRN

## 2019-02-07 MED ORDER — LORAZEPAM 2 MG/ML PO CONC
1.0000 mg | ORAL | Status: DC | PRN
  Administered 2019-02-07: 2 mg via SUBLINGUAL
  Filled 2019-02-07 (×2): qty 1

## 2019-02-07 MED ORDER — HALOPERIDOL LACTATE 2 MG/ML PO CONC
0.5000 mg | ORAL | Status: DC | PRN
  Administered 2019-02-07 (×2): 0.5 mg via SUBLINGUAL
  Filled 2019-02-07 (×3): qty 0.3

## 2019-02-07 MED ORDER — GLYCOPYRROLATE 1 MG PO TABS
1.0000 mg | ORAL_TABLET | ORAL | Status: DC | PRN
  Filled 2019-02-07: qty 1

## 2019-02-07 MED ORDER — HALOPERIDOL LACTATE 5 MG/ML IJ SOLN: 0.5000 mg | INTRAMUSCULAR | Status: DC | PRN

## 2019-02-07 MED ORDER — SODIUM CHLORIDE 0.9 % IV SOLN: INTRAVENOUS | Status: DC

## 2019-02-07 MED ORDER — HALOPERIDOL 0.5 MG PO TABS
0.5000 mg | ORAL_TABLET | ORAL | Status: DC | PRN
  Filled 2019-02-07: qty 1

## 2019-02-07 MED ORDER — SODIUM CHLORIDE 0.9 % IV SOLN
1.0000 mg/h | INTRAVENOUS | Status: DC
  Filled 2019-02-07: qty 10

## 2019-02-07 NOTE — Progress Notes (Addendum)
Glenda Stephens  EGB:[id number] DOB: [birth year] DOA: 2019-02-27 PCP: Karie Schwalbe, MD    Brief Narrative:  78 y.o. female resident of [facility] SNF with a Hx of myasthenia gravis, severe dementia, chronic debility/frailty-mostly bed to wheelchair bound-presented with shortness of breath, worsening mental status-found to have acute hypoxic respiratory failure secondary to COVID-19 pneumonia.  Significant Events: 11/5 admit to [facility] via [facility] ED  COVID-19 specific Treatment: Decadron 11/5 > 11/7 Remdesivir 11/5 > 11/7  Subjective: The patient does not respond to my voice or my exam.  She does not appear to be in acute distress at the time of my visit/she is resting comfortably.  Assessment & Plan:  Covid pneumonia Failed to improve with initial therapies -discussed the serious nature of the patient's current illness with her daughter who informs me the family agrees that pursuing comfort care is most appropriate at this time -medically it is clear to me that comfort care is the only effective care we can provide to her -begin comfort care now -monitor as inpatient over the next 48 hours to determine probable trajectory and decide if other disposition plans need to be considered such as [facility]  Acute metabolic encephalopathy superimposed on advanced dementia Due to acute severe hypoxia on baseline of dementia  Hypernatremia A result of significant dehydration in the setting of febrile illness and very limited intake with increased GI losses  Covid gastroenteritis -diarrhea  Myasthenia gravis  DVT prophylaxis: Comfort focused care Code Status: DNR - NO CODE  Family Communication: Spoke with daughter via phone who accepts that comfort focused care is the most appropriate plan at this point Disposition Plan: Comfort care -observe any current setting for approximately 48 hours -anticipate hospital death  Consultants:  none  Antimicrobials:  None  Objective:  Blood pressure (!) 110/91, pulse 74, temperature 98.2 F (36.8 C), temperature source Axillary, resp. rate (!) 22, height 5\' 8"  (1.727 m), weight 81.6 kg, SpO2 100 %.  Intake/Output Summary (Last 24 hours) at 02/07/2019 0914 Last data filed at 02/07/2019 0430 Gross per 24 hour  Intake 250 ml  Output 450 ml  Net -200 ml   Filed Weights   02-27-2019 2236 02/06/19 0505  Weight: 81.6 kg 81.6 kg    Examination: General: No acute respiratory distress -obtunded Lungs: Fine crackles diffusely Cardiovascular: Tachycardic -regular Abdomen: Thin, soft Extremities: No lower extremity edema  CBC: Recent Labs  Lab 02-27-2019 0940 02/06/19 0013 02/07/19 0225  WBC 10.5 9.3 11.1*  NEUTROABS 9.3* 8.1* 9.8*  HGB 14.0 13.8 14.8  HCT 42.7 42.7 45.8  MCV 93.4 94.9 95.8  PLT 210 196 204   Basic Metabolic Panel: Recent Labs  Lab 2019/02/27 0940 02/06/19 0013 02/07/19 0225  NA 143 147* 149*  K 3.2* 3.4* 3.1*  CL 107 110 113*  CO2 22 22 22   GLUCOSE 141* 105* 125*  BUN 18 19 19   CREATININE 0.77 0.60 0.75  CALCIUM 9.1 9.1 9.1  MG  --  1.9  --    GFR: Estimated Creatinine Clearance: 62.9 mL/min (by C-G formula based on SCr of 0.75 mg/dL).  Liver Function Tests: Recent Labs  Lab 2019/02/27 0940 02/06/19 0013 02/07/19 0225  AST 30 26 22   ALT 14 13 15   ALKPHOS 76 75 83  BILITOT 0.8 0.8 0.9  PROT 6.6 6.5 6.4*  ALBUMIN 3.0* 3.0* 2.8*    Coagulation Profile: Recent Labs  Lab February 27, 2019 1501  INR 1.1    HbA1C: Hgb A1c MFr Bld  Date/Time Value Ref Range Status  02/06/2019 12:13 AM 6.3 (H) 4.8 - 5.6 % Final    Comment:    (NOTE) Pre diabetes:          5.7%-6.4% Diabetes:              >6.4% Glycemic control for   <7.0% adults with diabetes   06/12/2017 07:07 AM 5.0 4.8 - 5.6 % Final    Comment:    (NOTE) Pre diabetes:          5.7%-6.4% Diabetes:              >6.4% Glycemic control for   <7.0% adults with diabetes     CBG: Recent Labs  Lab 02/06/19 0816 02/06/19 1203  02/06/19 1619 02/06/19 2050 02/07/19 0721  GLUCAP 123* 165* 110* 111* 156*     Scheduled Meds: . dexamethasone (DECADRON) injection  6 mg Intravenous Q24H  . enoxaparin (LOVENOX) injection  40 mg Subcutaneous Q12H  . insulin aspart  0-9 Units Subcutaneous TID WC  . memantine  10 mg Oral BID  . sodium chloride flush  3 mL Intravenous Q12H  . vitamin C  500 mg Oral Daily  . zinc sulfate  220 mg Oral Daily     LOS: 2 days   Cherene Altes, MD [facility] Office  [phone] Pager - Text Page per Amion  If 7PM-7AM, please contact night-coverage per Amion 02/07/2019, 9:14 AM

## 2019-02-07 NOTE — Progress Notes (Signed)
RN spoke with daughter on the phone. Family waiting to see if patient is going to hospice.

## 2019-02-08 NOTE — Progress Notes (Signed)
Family was here to see patient. They were very grateful to see their mom. Pt. Is comfortable. No further concerns.

## 2019-02-08 NOTE — Progress Notes (Signed)
Glenda Stephens  [id number]:[id number] DOB: 1940/09/06 DOA: 02/12/2019 PCP: Karie Schwalbe, MD    Brief Narrative:  78 y.o. female resident of [facility] SNF with a Hx of myasthenia gravis, severe dementia, chronic debility/frailty-mostly bed to wheelchair bound-presented with shortness of breath, worsening mental status-found to have acute hypoxic respiratory failure secondary to COVID-19 pneumonia.  Significant Events: 11/5 admit to [facility] via [facility] ED  COVID-19 specific Treatment: Decadron 11/5 > 11/7 Remdesivir 11/5 > 11/7  Subjective: We have transitioned to full comfort care.  An IV was able to be obtained and the patient is now on a continuous morphine drip.  Her respirations are irregular but she appears to be resting comfortably.  She has clearly declined since yesterday.  Assessment & Plan:  Covid pneumonia The patient appears to be actively dying -arrangements have been made for the family to make a visit today -anticipate hospital death  Acute metabolic encephalopathy superimposed on advanced dementia Due to acute severe hypoxia on baseline of dementia  Hypernatremia A result of significant dehydration in the setting of febrile illness and very limited intake with increased GI losses  Covid gastroenteritis - diarrhea  Myasthenia gravis  DVT prophylaxis: Comfort focused care Code Status: DNR - NO CODE  Family Communication: Family coming to visit patient today Disposition Plan: Comfort care -anticipate hospital death  Consultants:  none  Antimicrobials:  None  Objective: Blood pressure (!) 146/44, pulse 78, temperature 97.6 F (36.4 C), temperature source Axillary, resp. rate (!) 22, height 5\' 8"  (1.727 m), weight 81.6 kg, SpO2 92 %.  Intake/Output Summary (Last 24 hours) at 02/08/2019 0832 Last data filed at 02/08/2019 0600 Gross per 24 hour  Intake 7.41 ml  Output 0 ml  Net 7.41 ml   [facility] Weights   February 12, 2019 2236 02/06/19 0505  Weight: 81.6 kg 81.6  kg    Examination: Respirations irregular - no outward signs of anxiety or discomfort  CBC: Recent Labs  Lab 02/12/19 0940 02/06/19 0013 02/07/19 0225  WBC 10.5 9.3 11.1*  NEUTROABS 9.3* 8.1* 9.8*  HGB 14.0 13.8 14.8  HCT 42.7 42.7 45.8  MCV 93.4 94.9 95.8  PLT 210 196 204   Basic Metabolic Panel: Recent Labs  Lab 02-12-2019 0940 02/06/19 0013 02/07/19 0225  NA 143 147* 149*  K 3.2* 3.4* 3.1*  CL 107 110 113*  CO2 22 22 22   GLUCOSE 141* 105* 125*  BUN 18 19 19   CREATININE 0.77 0.60 0.75  CALCIUM 9.1 9.1 9.1  MG  --  1.9  --    GFR: Estimated Creatinine Clearance: 62.9 mL/min (by C-G formula based on SCr of 0.75 mg/dL).  Liver Function Tests: Recent Labs  Lab 12-Feb-2019 0940 02/06/19 0013 02/07/19 0225  AST 30 26 22   ALT 14 13 15   ALKPHOS 76 75 83  BILITOT 0.8 0.8 0.9  PROT 6.6 6.5 6.4*  ALBUMIN 3.0* 3.0* 2.8*    Coagulation Profile: Recent Labs  Lab 2019/02/12 1501  INR 1.1    HbA1C: Hgb A1c MFr Bld  Date/Time Value Ref Range Status  02/06/2019 12:13 AM 6.3 (H) 4.8 - 5.6 % Final    Comment:    (NOTE) Pre diabetes:          5.7%-6.4% Diabetes:              >6.4% Glycemic control for   <7.0% adults with diabetes   06/12/2017 07:07 AM 5.0 4.8 - 5.6 % Final    Comment:    (  NOTE) Pre diabetes:          5.7%-6.4% Diabetes:              >6.4% Glycemic control for   <7.0% adults with diabetes     CBG: Recent Labs  Lab 02/06/19 0816 02/06/19 1203 02/06/19 1619 02/06/19 2050 02/07/19 0721  GLUCAP 123* 165* 110* 111* 156*      LOS: 3 days   Cherene Altes, MD [facility] Office  [phone] Pager - Text Page per Amion  If 7PM-7AM, please contact night-coverage per Amion 02/08/2019, 8:32 AM

## 2019-02-08 NOTE — Progress Notes (Signed)
RN called daughter Jackelyn Poling to come and see patient. Pt.'s breathing has changed and non responsive. Will continuo to monitor. Visit scheduled for 1:30 pm.

## 2019-02-09 LAB — GLUCOSE, CAPILLARY: Glucose-Capillary: 139 mg/dL — ABNORMAL HIGH (ref 70–99)

## 2019-02-10 LAB — CULTURE, BLOOD (ROUTINE X 2)
Culture: NO GROWTH
Culture: NO GROWTH
Special Requests: ADEQUATE
Special Requests: ADEQUATE

## 2019-03-03 NOTE — Discharge Summary (Addendum)
   Death Summary   Glenda Stephens [id number]:[id number] DOB: [birth year] DOA: 02-26-19  PCP: Venia Carbon, MD  Admit date: 02-26-19 Date of Death: 03-02-2019  Final Diagnoses:  Principal Problem:   Pneumonia due to COVID-19 virus Active Problems:   Alzheimer's disease with late onset ([place])   Acute respiratory failure due to COVID-19 ([place])   CKD (chronic kidney disease)   Myasthenia gravis ([place])   HTN (hypertension)   DM (diabetes mellitus) ([place]) Clinically suspected PE   History of present illness:  78 y.o. female resident of [facility] SNF with a Hx of myasthenia gravis, severe dementia, chronic debility/frailty - mostly bed to wheelchair bound - who presented with shortness of breath and worsening mental status and was found to have acute hypoxic respiratory failure secondary to COVID-19 pneumonia.  Hospital Course:  The patient was admitted to [facility] for treatment of her Covid pneumonia.  It was recognized that she had severe disease and that her prognosis was very poor.  After discussion with the patient's daughter the decision was made to provide conservative medical care initially and to monitor for any signs of improvement.  This was carried out with the use of steroids and remdesivir.  Unfortunately the patient revealed no signs of improving from her severe illness.  The decision was then made to transition to full comfort focused care.  This was provided in the acute units.  Morphine drip was utilized to assure the patient was comfortable and indeed she suffered with no respiratory distress or uncontrolled pain.  Arrangements were made for the patient's family to visit her prior to her death.  She died peacefully 02-Mar-2019 at 01:15 with her cause of death being Covid pneumonia.   Signed:  Cherene Altes  [facility] 02/18/2019, 7:21 PM

## 2019-03-03 DEATH — deceased
